# Patient Record
Sex: Male | Born: 1965 | Race: White | Hispanic: No | Marital: Single | Smoking: Never smoker
Health system: Southern US, Community
[De-identification: ages and names within clinical notes are randomized; demographics above are authoritative.]

## PROBLEM LIST (undated history)

## (undated) DIAGNOSIS — E669 Obesity, unspecified: Secondary | ICD-10-CM

## (undated) DIAGNOSIS — I712 Thoracic aortic aneurysm, without rupture: Secondary | ICD-10-CM

## (undated) DIAGNOSIS — I10 Essential (primary) hypertension: Secondary | ICD-10-CM

## (undated) DIAGNOSIS — I480 Paroxysmal atrial fibrillation: Secondary | ICD-10-CM

## (undated) DIAGNOSIS — I719 Aortic aneurysm of unspecified site, without rupture: Secondary | ICD-10-CM

## (undated) HISTORY — DX: Essential (primary) hypertension: I10

## (undated) HISTORY — DX: Thoracic aortic aneurysm, without rupture: I71.2

## (undated) HISTORY — DX: Aortic aneurysm of unspecified site, without rupture: I71.9

## (undated) HISTORY — DX: Obesity, unspecified: E66.9

## (undated) HISTORY — DX: Paroxysmal atrial fibrillation: I48.0

---

## 2014-11-04 ENCOUNTER — Emergency Department (INDEPENDENT_AMBULATORY_CARE_PROVIDER_SITE_OTHER)
Admission: EM | Admit: 2014-11-04 | Discharge: 2014-11-04 | Disposition: A | Discharge status: Home / Self Care | Payer: Self-pay | Source: Home / Self Care | Attending: Family Medicine | Admitting: Family Medicine

## 2014-11-04 ENCOUNTER — Encounter (HOSPITAL_COMMUNITY): Payer: Self-pay | Admitting: *Deleted

## 2014-11-04 ENCOUNTER — Emergency Department (INDEPENDENT_AMBULATORY_CARE_PROVIDER_SITE_OTHER): Payer: Self-pay

## 2014-11-04 DIAGNOSIS — S322XXA Fracture of coccyx, initial encounter for closed fracture: Secondary | ICD-10-CM

## 2014-11-04 MED ORDER — DICLOFENAC POTASSIUM 50 MG PO TABS: 50.0000 mg | ORAL_TABLET | Freq: Three times a day (TID) | ORAL | Status: DC
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
## 2014-11-04 NOTE — Discharge Instructions (Signed)
Heat, sit on cushion for comfort, see orthopedist if further problems
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
## 2016-07-11 DIAGNOSIS — I712 Thoracic aortic aneurysm, without rupture: Secondary | ICD-10-CM

## 2016-07-11 DIAGNOSIS — I7121 Aneurysm of the ascending aorta, without rupture: Secondary | ICD-10-CM

## 2016-07-11 HISTORY — DX: Thoracic aortic aneurysm, without rupture: I71.2

## 2016-07-11 HISTORY — DX: Aneurysm of the ascending aorta, without rupture: I71.21

## 2016-07-23 ENCOUNTER — Encounter (HOSPITAL_COMMUNITY): Payer: Self-pay | Admitting: Oncology

## 2016-07-23 ENCOUNTER — Emergency Department (HOSPITAL_COMMUNITY)
Admission: EM | Admit: 2016-07-23 | Discharge: 2016-07-24 | DRG: 309 | Disposition: A | Discharge status: Home / Self Care | Payer: 59 | Attending: Cardiovascular Disease | Admitting: Cardiovascular Disease

## 2016-07-23 DIAGNOSIS — E876 Hypokalemia: Secondary | ICD-10-CM | POA: Diagnosis not present

## 2016-07-23 DIAGNOSIS — Z6841 Body Mass Index (BMI) 40.0 and over, adult: Secondary | ICD-10-CM | POA: Diagnosis not present

## 2016-07-23 DIAGNOSIS — I4891 Unspecified atrial fibrillation: Secondary | ICD-10-CM | POA: Diagnosis present

## 2016-07-23 DIAGNOSIS — R0602 Shortness of breath: Secondary | ICD-10-CM

## 2016-07-23 DIAGNOSIS — I480 Paroxysmal atrial fibrillation: Secondary | ICD-10-CM | POA: Diagnosis present

## 2016-07-23 DIAGNOSIS — E669 Obesity, unspecified: Secondary | ICD-10-CM

## 2016-07-23 LAB — CBC WITH DIFFERENTIAL/PLATELET
BASOS ABS: 0.1 10*3/uL (ref 0.0–0.1)
BASOS PCT: 0 %
Eosinophils Absolute: 0.6 10*3/uL (ref 0.0–0.7)
Eosinophils Relative: 5 %
HCT: 45.9 % (ref 39.0–52.0)
Hemoglobin: 15.5 g/dL (ref 13.0–17.0)
LYMPHS PCT: 34 %
Lymphs Abs: 3.9 10*3/uL (ref 0.7–4.0)
MCH: 30.3 pg (ref 26.0–34.0)
MCHC: 33.8 g/dL (ref 30.0–36.0)
MCV: 89.6 fL (ref 78.0–100.0)
Monocytes Absolute: 0.9 10*3/uL (ref 0.1–1.0)
Monocytes Relative: 8 %
Neutro Abs: 5.9 10*3/uL (ref 1.7–7.7)
Neutrophils Relative %: 53 %
Platelets: 343 10*3/uL (ref 150–400)
RBC: 5.12 MIL/uL (ref 4.22–5.81)
RDW: 13.4 % (ref 11.5–15.5)
WBC: 11.3 10*3/uL — AB (ref 4.0–10.5)

## 2016-07-23 LAB — RAPID URINE DRUG SCREEN, HOSP PERFORMED
AMPHETAMINES: NOT DETECTED
BENZODIAZEPINES: NOT DETECTED
Barbiturates: NOT DETECTED
COCAINE: NOT DETECTED
Opiates: NOT DETECTED
Tetrahydrocannabinol: NOT DETECTED

## 2016-07-23 MED ORDER — DILTIAZEM HCL-DEXTROSE 100-5 MG/100ML-% IV SOLN (PREMIX)
5.0000 mg/h | INTRAVENOUS | Status: DC
  Administered 2016-07-23: 5 mg/h via INTRAVENOUS
  Filled 2016-07-23 (×2): qty 100

## 2016-07-23 MED ORDER — DILTIAZEM LOAD VIA INFUSION
10.0000 mg | Freq: Once | INTRAVENOUS | Status: AC
  Administered 2016-07-23: 10 mg via INTRAVENOUS
  Filled 2016-07-23: qty 10

## 2016-07-23 MED ORDER — ADENOSINE 6 MG/2ML IV SOLN
INTRAVENOUS | Status: AC
  Filled 2016-07-23: qty 2

## 2016-07-23 MED ORDER — ADENOSINE 6 MG/2ML IV SOLN
6.0000 mg | Freq: Once | INTRAVENOUS | Status: AC
  Administered 2016-07-23: 6 mg via INTRAVENOUS

## 2016-07-23 MED ORDER — ADENOSINE 6 MG/2ML IV SOLN
12.0000 mg | Freq: Once | INTRAVENOUS | Status: AC
  Administered 2016-07-23: 12 mg via INTRAVENOUS

## 2016-07-23 NOTE — ED Triage Notes (Signed)
Pt states he was sitting on the couch when he began to feel like his heart was, "Fluttering."  Pt states that he attempted to have a BM w/o relief.  Pt then developed Shob.  Pt is able to speak in full sentences at this time.

## 2016-07-23 NOTE — ED Notes (Signed)
ED Provider at bedside. 

## 2016-07-23 NOTE — ED Provider Notes (Signed)
[facility]-EMERGENCY DEPT Provider Note   CSN: [id number] Arrival date & time: 07/23/16  2208     History   Chief Complaint Chief Complaint  Patient presents with  . Tachycardia    HPI Kenneth Kirby is a 51 y.o. male.  The patient presents to the emergency department with complaint of a feeling his heart was 'fluttering'. Symptoms started around 9:00 pm while at rest. No chest pain or tightness. Later he developed SOB that was worse with exertion. No nausea or vomiting. He felt he needed to have a bowel movement and attempted to but was unable. No lightheadedness, dizziness, syncope/near syncope. He denies similar symptoms in the past.    The history is provided by the patient. No language interpreter was used.    History reviewed. No pertinent past medical history.  There are no active problems to display for this patient.   History reviewed. No pertinent surgical history.     Home Medications    Prior to Admission medications   Medication Sig Start Date End Date Taking? Authorizing Provider  diclofenac (CATAFLAM) 50 MG tablet Take 1 tablet (50 mg total) by mouth 3 (three) times daily. 11/04/14   Linna HoffKindl, James D, MD  HYDROcodone-acetaminophen (NORCO/VICODIN) 5-325 MG tablet take 1 tablet by mouth every 8 to 12 hours if needed 06/27/16   [provider]  Naproxen (NAPROSYN PO) Take by mouth.    [provider]  phentermine 15 MG capsule Take 15 mg by mouth.    [provider]    Family History No family history on file.  Social History Social History  Substance Use Topics  . Smoking status: Never Smoker  . Smokeless tobacco: Never Used  . Alcohol use Yes     Allergies   Patient has no known allergies.   Review of Systems Review of Systems  Constitutional: Negative for chills, diaphoresis and fever.  HENT: Negative.   Respiratory: Positive for shortness of breath.   Cardiovascular: Positive for palpitations. Negative for chest pain.    Gastrointestinal: Negative.   Musculoskeletal: Negative.   Skin: Negative.   Neurological: Negative.      Physical Exam Updated Vital Signs BP 133/77   Pulse (!) 120   Temp 98.2 F (36.8 C) (Oral)   Resp 13   SpO2 94%   Physical Exam  Constitutional: He is oriented to person, place, and time. He appears well-developed and well-nourished.  HENT:  Head: Normocephalic.  Neck: Normal range of motion. Neck supple.  Cardiovascular: An irregularly irregular rhythm present. Tachycardia present.   Pulmonary/Chest: Effort normal and breath sounds normal.  Abdominal: Soft. Bowel sounds are normal. There is no tenderness. There is no rebound and no guarding.  Musculoskeletal: Normal range of motion. He exhibits no edema.  Neurological: He is alert and oriented to person, place, and time.  Skin: Skin is warm and dry. No rash noted.  Psychiatric: He has a normal mood and affect.     ED Treatments / Results  Labs (all labs ordered are listed, but only abnormal results are displayed) Labs Reviewed  CBC WITH DIFFERENTIAL/PLATELET  COMPREHENSIVE METABOLIC PANEL  TROPONIN I  RAPID URINE DRUG SCREEN, HOSP PERFORMED   Results for orders placed or performed during the hospital encounter of 07/23/16  CBC with Differential  Result Value Ref Range   WBC 11.3 (H) 4.0 - 10.5 K/uL   RBC 5.12 4.22 - 5.81 MIL/uL   Hemoglobin 15.5 13.0 - 17.0 g/dL   HCT 40.945.9 81.139.0 - 91.452.0 %  MCV 89.6 78.0 - 100.0 fL   MCH 30.3 26.0 - 34.0 pg   MCHC 33.8 30.0 - 36.0 g/dL   RDW 16.1 09.6 - 04.5 %   Platelets 343 150 - 400 K/uL   Neutrophils Relative % 53 %   Neutro Abs 5.9 1.7 - 7.7 K/uL   Lymphocytes Relative 34 %   Lymphs Abs 3.9 0.7 - 4.0 K/uL   Monocytes Relative 8 %   Monocytes Absolute 0.9 0.1 - 1.0 K/uL   Eosinophils Relative 5 %   Eosinophils Absolute 0.6 0.0 - 0.7 K/uL   Basophils Relative 0 %   Basophils Absolute 0.1 0.0 - 0.1 K/uL  Comprehensive metabolic panel  Result Value Ref Range    Sodium 142 135 - 145 mmol/L   Potassium 3.3 (L) 3.5 - 5.1 mmol/L   Chloride 107 101 - 111 mmol/L   CO2 23 22 - 32 mmol/L   Glucose, Bld 117 (H) 65 - 99 mg/dL   BUN 16 6 - 20 mg/dL   Creatinine, Ser 4.09 0.61 - 1.24 mg/dL   Calcium 9.2 8.9 - 81.1 mg/dL   Total Protein 7.8 6.5 - 8.1 g/dL   Albumin 4.6 3.5 - 5.0 g/dL   AST 29 15 - 41 U/L   ALT 31 17 - 63 U/L   Alkaline Phosphatase 77 38 - 126 U/L   Total Bilirubin 0.6 0.3 - 1.2 mg/dL   GFR calc non Af Amer >60 >60 mL/min   GFR calc Af Amer >60 >60 mL/min   Anion gap 12 5 - 15  Troponin I  Result Value Ref Range   Troponin I <0.03 <0.03 ng/mL  Urine rapid drug screen (hosp performed)  Result Value Ref Range   Opiates NONE DETECTED NONE DETECTED   Cocaine NONE DETECTED NONE DETECTED   Benzodiazepines NONE DETECTED NONE DETECTED   Amphetamines NONE DETECTED NONE DETECTED   Tetrahydrocannabinol NONE DETECTED NONE DETECTED   Barbiturates NONE DETECTED NONE DETECTED     EKG  EKG Interpretation None       Radiology No results found.  Procedures Procedures (including critical care time)  Medications Ordered in ED Medications  diltiazem (CARDIZEM) 1 mg/mL load via infusion 10 mg (10 mg Intravenous Bolus from Bag 07/23/16 2257)    And  diltiazem (CARDIZEM) 100 mg in dextrose 5% (1 mg/mL) infusion (5 mg/hr Intravenous New Bag/Given 07/23/16 2259)  adenosine (ADENOCARD) 6 MG/2ML injection 6 mg (6 mg Intravenous Given 07/23/16 2234)  adenosine (ADENOCARD) 6 MG/2ML injection 12 mg (12 mg Intravenous Given 07/23/16 2237)   CRITICAL CARE Performed by: Elpidio Anis A   Total critical care time: 45 minutes  Critical care time was exclusive of separately billable procedures and treating other patients.  Critical care was necessary to treat or prevent imminent or life-threatening deterioration.  Critical care was time spent personally by me on the following activities: development of treatment plan with patient and/or  surrogate as well as nursing, discussions with consultants, evaluation of patient's response to treatment, examination of patient, obtaining history from patient or surrogate, ordering and performing treatments and interventions, ordering and review of laboratory studies, ordering and review of radiographic studies, pulse oximetry and re-evaluation of patient's condition.   Initial Impression / Assessment and Plan / ED Course  I have reviewed the triage vital signs and the nursing notes.  Pertinent labs & imaging results that were available during my care of the patient were reviewed by me and considered in my medical  decision making (see chart for details).     Patient with atrial fibrillation on arrival to ED RVR to rate as high as 180. No chest pain. No history arrhythmia.   6 mg IV adenosine given without change followed by 12 mg Adenosine, also without change.   Cardioversion vs cardizem discussed and 10 mg IV Cardizem bolus started with infusion following.   He is feeling improved - heart rate now from 90's to 120, confirmed to be atrial fibrillation. Discussed with cardiology, Dr. Dimple Casey, who accepts the patient for transfer to [facility] onto his service. Patient and family updated.   Final Clinical Impressions(s) / ED Diagnoses   Final diagnoses:  None   1. Atrial fibrillation with RVR   New Prescriptions New Prescriptions   No medications on file     Elpidio Anis, Cordelia Poche 07/24/16 1610    Devoria Albe, MD 07/24/16 [phone]

## 2016-07-23 NOTE — ED Notes (Signed)
Bed: WA07 Expected date:  Expected time:  Means of arrival:  Comments: 

## 2016-07-24 ENCOUNTER — Inpatient Hospital Stay (HOSPITAL_COMMUNITY): Payer: 59

## 2016-07-24 ENCOUNTER — Telehealth: Payer: Self-pay | Admitting: Internal Medicine

## 2016-07-24 DIAGNOSIS — E669 Obesity, unspecified: Secondary | ICD-10-CM | POA: Diagnosis not present

## 2016-07-24 DIAGNOSIS — R0602 Shortness of breath: Secondary | ICD-10-CM

## 2016-07-24 DIAGNOSIS — I4891 Unspecified atrial fibrillation: Secondary | ICD-10-CM

## 2016-07-24 DIAGNOSIS — E876 Hypokalemia: Secondary | ICD-10-CM | POA: Diagnosis not present

## 2016-07-24 DIAGNOSIS — I480 Paroxysmal atrial fibrillation: Secondary | ICD-10-CM | POA: Diagnosis present

## 2016-07-24 LAB — COMPREHENSIVE METABOLIC PANEL
ALK PHOS: 77 U/L (ref 38–126)
ALT: 31 U/L (ref 17–63)
ANION GAP: 12 (ref 5–15)
AST: 29 U/L (ref 15–41)
Albumin: 4.6 g/dL (ref 3.5–5.0)
BUN: 16 mg/dL (ref 6–20)
CALCIUM: 9.2 mg/dL (ref 8.9–10.3)
CO2: 23 mmol/L (ref 22–32)
CREATININE: 1.16 mg/dL (ref 0.61–1.24)
Chloride: 107 mmol/L (ref 101–111)
GFR calc Af Amer: >60 mL/min (ref >60)
GFR calc non Af Amer: >60 mL/min (ref >60)
Glucose, Bld: 117 mg/dL — ABNORMAL HIGH (ref 65–99)
Potassium: 3.3 mmol/L — ABNORMAL LOW (ref 3.5–5.1)
SODIUM: 142 mmol/L (ref 135–145)
Total Bilirubin: 0.6 mg/dL (ref 0.3–1.2)
Total Protein: 7.8 g/dL (ref 6.5–8.1)

## 2016-07-24 LAB — APTT: aPTT: 27 seconds (ref 24–36)

## 2016-07-24 LAB — TSH: TSH: 1.92 u[IU]/mL (ref 0.350–4.500)

## 2016-07-24 LAB — T4, FREE: Free T4: 1.07 ng/dL (ref 0.61–1.12)

## 2016-07-24 LAB — PROTIME-INR
INR: 0.95
PROTHROMBIN TIME: 12.7 s (ref 11.4–15.2)

## 2016-07-24 LAB — TROPONIN I: Troponin I: <0.03 ng/mL (ref <0.03)

## 2016-07-24 LAB — MAGNESIUM: Magnesium: 2.2 mg/dL (ref 1.7–2.4)

## 2016-07-24 LAB — BRAIN NATRIURETIC PEPTIDE: B NATRIURETIC PEPTIDE 5: 23.9 pg/mL (ref 0.0–100.0)

## 2016-07-24 MED ORDER — HEPARIN (PORCINE) IN NACL 100-0.45 UNIT/ML-% IJ SOLN
1500.0000 [IU]/h | INTRAMUSCULAR | Status: DC
  Administered 2016-07-24: 1500 [IU]/h via INTRAVENOUS
  Filled 2016-07-24: qty 250

## 2016-07-24 MED ORDER — DILTIAZEM HCL ER COATED BEADS 120 MG PO CP24: 120.0000 mg | ORAL_CAPSULE | Freq: Every day | ORAL | 1 refills | Status: DC

## 2016-07-24 MED ORDER — APIXABAN 5 MG PO TABS: 5.0000 mg | ORAL_TABLET | Freq: Two times a day (BID) | ORAL | 1 refills | Status: DC

## 2016-07-24 MED ORDER — MIDAZOLAM HCL 2 MG/2ML IJ SOLN
INTRAMUSCULAR | Status: AC | PRN
  Administered 2016-07-24: 4 mg via INTRAVENOUS

## 2016-07-24 MED ORDER — METOPROLOL TARTRATE 25 MG PO TABS
25.0000 mg | ORAL_TABLET | Freq: Two times a day (BID) | ORAL | Status: DC
  Administered 2016-07-24: 25 mg via ORAL
  Filled 2016-07-24: qty 1

## 2016-07-24 MED ORDER — ONDANSETRON HCL 4 MG/2ML IJ SOLN: 4.0000 mg | Freq: Four times a day (QID) | INTRAMUSCULAR | Status: DC | PRN

## 2016-07-24 MED ORDER — DILTIAZEM HCL ER COATED BEADS 120 MG PO CP24
120.0000 mg | ORAL_CAPSULE | Freq: Every day | ORAL | Status: DC
  Administered 2016-07-24: 120 mg via ORAL
  Filled 2016-07-24 (×2): qty 1

## 2016-07-24 MED ORDER — APIXABAN 5 MG PO TABS
5.0000 mg | ORAL_TABLET | Freq: Two times a day (BID) | ORAL | Status: DC
  Administered 2016-07-24: 5 mg via ORAL
  Filled 2016-07-24: qty 1

## 2016-07-24 MED ORDER — POTASSIUM CHLORIDE CRYS ER 20 MEQ PO TBCR
40.0000 meq | EXTENDED_RELEASE_TABLET | Freq: Once | ORAL | Status: AC
  Administered 2016-07-24: 40 meq via ORAL
  Filled 2016-07-24: qty 2

## 2016-07-24 MED ORDER — ETOMIDATE 2 MG/ML IV SOLN
INTRAVENOUS | Status: AC | PRN
  Administered 2016-07-24: 4 mg via INTRAVENOUS

## 2016-07-24 MED ORDER — HEPARIN BOLUS VIA INFUSION
5000.0000 [IU] | Freq: Once | INTRAVENOUS | Status: AC
  Administered 2016-07-24: 5000 [IU] via INTRAVENOUS
  Filled 2016-07-24: qty 5000

## 2016-07-24 MED ORDER — MIDAZOLAM HCL 2 MG/2ML IJ SOLN
4.0000 mg | Freq: Once | INTRAMUSCULAR | Status: AC
  Administered 2016-07-24: 2 mg via INTRAVENOUS
  Filled 2016-07-24: qty 4

## 2016-07-24 MED ORDER — ETOMIDATE 2 MG/ML IV SOLN
16.0000 mg | Freq: Once | INTRAVENOUS | Status: AC
  Administered 2016-07-24: 4 mg via INTRAVENOUS
  Filled 2016-07-24: qty 10

## 2016-07-24 MED ORDER — ACETAMINOPHEN 325 MG PO TABS: 650.0000 mg | ORAL_TABLET | ORAL | Status: DC | PRN

## 2016-07-24 NOTE — Telephone Encounter (Signed)
Received call from Elpidio AnisShari Upstill, PA-C.  This is a 51 year old man with no significant past medical history who presented in atrial fibrillation with RVR (HR 180s). He received Adenosine x 2 with no effect. He then received a bolus of Diltiazem and was placed on gtt, resulting in good rate control (HR ~90). He has no obvious precipitating trigger (no drugs/aclohol, infection, etc). I have accepted him cardiology inpatient service ([facility] for management of his atrial fibrillation.  Berdine DanceEric Pauley, MD

## 2016-07-24 NOTE — Progress Notes (Signed)
   Message sent to our office for outpatient echo and follow up with Afib Clinic or Dr. Duke Salviaandolph, MD in 7 days.

## 2016-07-24 NOTE — Discharge Instructions (Signed)
Call Dr. Leonides Sakeandolph's office to schedule follow-up in the atrial fibrillation clinic next week. Return to the emergency department if you have any recurrence of racing heart, chest pain or palpitations

## 2016-07-24 NOTE — H&P (Addendum)
Patient ID: Kenneth Kirby MRN: [id number], DOB/AGE: 1965/09/04  Admit date: 07/23/2016 Primary Physician: Patient, No Pcp Per Primary Cardiologist: N/A  CARDIOLOGY ADMISSION History & Physical    HISTORY OF PRESENT ILLNESS   51 year old man with no significant past medical history presents with afib with RVR. He was in his normal state of health and feeling well until this evening when he was watching TV and suddenly felt his heart racing. He felt as though he could not get a deep breath and did feel fatigued. No CP, diaphoresis, syncope, presyncope. He presented to the ED where he was found to be in afib with RVR (HR 180's). He did not respond to adenosine x 2, but did respond to diltiazem push + gtt. He is now comfortable and feeling well.  He tells me that he had two energy drinks on 7/13. No illicit drug use or binge drinking. No recent illnesses. He was on prednisone and antibiotics for a wound to his scalp, but those medications finished several days ago. No fevers, chills, nausea, vomiting, diarrhea.  Review of Systems All other systems reviewed and are otherwise negative except as noted above.   MEDICAL, FAMILY, AND SOCIAL HISTORY  History reviewed. No pertinent past medical history.  History reviewed. No pertinent surgical history.  No Known Allergies Prior to Admission medications   Medication Sig Start Date End Date Taking? Authorizing Provider  diclofenac (CATAFLAM) 50 MG tablet Take 1 tablet (50 mg total) by mouth 3 (three) times daily. 11/04/14   Linna Hoff, MD  HYDROcodone-acetaminophen (NORCO/VICODIN) 5-325 MG tablet take 1 tablet by mouth every 8 to 12 hours if needed 06/27/16   [provider]  Naproxen (NAPROSYN PO) Take by mouth.    [provider]  phentermine 15 MG capsule Take 15 mg by mouth.    [provider]   No family history on file. Social History   Social History  . Marital status: Single    Spouse name: N/A  . Number of  children: N/A  . Years of education: N/A   Occupational History  . Not on file.   Social History Main Topics  . Smoking status: Never Smoker  . Smokeless tobacco: Never Used  . Alcohol use Yes  . Drug use: No  . Sexual activity: Yes   Other Topics Concern  . Not on file   Social History Narrative  . No narrative on file      PHYSICAL EXAM  Blood pressure (!) 157/107, pulse (!) 112, temperature 98.2 F (36.8 C), temperature source Oral, resp. rate 19, SpO2 97 %.  General: Pleasant, NAD Psych: Normal affect. Neuro: Alert and oriented X 3. Moves all extremities spontaneously. HEENT: Sclera anicteric, noninjected. MMM.  Neck: no bruits. JVP is not elevated. Lungs:  Clear, normal WOB Heart: tachycardic, irregular, no m/r/g Abdomen: Soft, non-tender, non-distended, BS + x 4.  Extremities: No clubbing or cyanosis. Edema: trace BL. DP/PT/Radials 2+ and equal bilaterally.   LABS and STUDIES  Troponin (Point of Care Test) No results for input(s): TROPIPOC in the last 72 hours.  Recent Labs  07/23/16 2309  TROPONINI <0.03   Lab Results  Component Value Date   WBC 11.3 (H) 07/23/2016   HGB 15.5 07/23/2016   HCT 45.9 07/23/2016   MCV 89.6 07/23/2016   PLT 343 07/23/2016    Recent Labs Lab 07/23/16 2309  NA 142  K 3.3*  CL 107  CO2 23  BUN 16  CREATININE  1.16  CALCIUM 9.2  PROT 7.8  BILITOT 0.6  ALKPHOS 77  ALT 31  AST 29  GLUCOSE 117*   No results found for: CHOL, HDL, LDLCALC, TRIG No results found for: DDIMER   Radiology/Studies: pending  ECG was independently reviewed. Afib with RVR, inferior/lateral ST depressions (HR 180) Repeat ECG is pending  Echocardiogram: pending   ASSESSMENT and PLAN   Blythe StanfordBrian Madding is a 51 year old man with no significant past medical history who presents with afib with RVR.  Afib with RVR. Currently rate controlled on Diltiazem drip. The patient has no clear precipitating causes for his afib.  Starting Metoprolol 25 mg  BID in an attempt to DC Dilt drip. This dose can be uptitrated PRN  Anticoagulation with Heparin for possible DCCV in AM  Regarding long-term stroke risk reduction, CHA2DS2-VASc = 0 (assuming no HTN or CHF)  K>4, Mg>2  TSH, T4, A1C, Lipids are all pending. Urine tox was negative. He denies ETOH use.  Given body habitus he is at risk for OSA. Sleep study can be considered as an outpatient   Echocardiogram is pending.  Hypokalemia  Potassium oral repletion  Obesity  May benefit from outpatient sleep study as mentioned above  IVF: None Diet: NPO DVT PPX: Heparin Dispo: Admit to Obs, cardiology Code Status: Full code   PAULEY, ERIC D, MD 07/24/2016, 4:41 AM   Attending Addendum: Mr. Kenneth Kirby is a 89M with obesity and no known prior medical history who presented with sudden onset of palpitations 07/23/16 at 21:00.  He denies chest pain, shortness of breath, lower extremity edema, orthopnea or PND prior to developing palpitations.  He has been otherwise in his usual state of health.  He does not drink much caffeine or EtOH.  He endorses snoring, but fiance denies apneic episodes.  His mother was recently diagnosed with atrial fibrillation.  He did note feeling short of breath while walking across the parking lot to the ED.  Initial heart rate was 191.  He did not convert with adenosine.  He was started on a diltiazem drip and oral metoprolol.  Heart rates are currently in the 100s and he is comfortable on diltiazem at 5mg /hr.  He is euvolemic on exam.  Exam is unremarkable other than an irregularly irregular heart room.  We will plan to cardiovert him in the ED.  Start diltiazem 120mg  daily and Eliquis 5mg  bid.  Plan for out patient TTE and sleep study.  Follow up in atrial fibrillation clinic this week and with me in one month.  Hersh Minney C. Duke Salviaandolph, MD, [facility]  07/24/2016 9:05 AM

## 2016-07-24 NOTE — Progress Notes (Signed)
ANTICOAGULATION CONSULT NOTE - Initial Consult  Pharmacy Consult for apixaban Indication: atrial fibrillation  No Known Allergies  Patient Measurements: Height: 6' (182.9 cm) Weight: (!) 302 lb (137 kg) IBW/kg (Calculated) : 77.6 Heparin Dosing Weight: 96 kg  Vital Signs: Temp: 98.2 F (36.8 C) (07/13 2222) Temp Source: Oral (07/13 2222) BP: 133/96 (07/14 0937) Pulse Rate: 77 (07/14 0937)  Labs:  Recent Labs  07/23/16 2309 07/24/16 0504  HGB 15.5  --   HCT 45.9  --   PLT 343  --   APTT  --  27  LABPROT  --  12.7  INR  --  0.95  CREATININE 1.16  --   TROPONINI <0.03  --     Estimated Creatinine Clearance: 110.5 mL/min (by C-G formula based on SCr of 1.16 mg/dL).   Medical History: History reviewed. No pertinent past medical history.  Medications:  Scheduled:  . apixaban  5 mg Oral BID  . diltiazem  120 mg Oral Daily  . etomidate  16 mg Intravenous Once  . metoprolol tartrate  25 mg Oral BID  . midazolam  4 mg Intravenous Once   Infusions:    Assessment: [age] yoM with new onset A-fib. apixaban per Rx.  Goal of Therapy:  Monitor platelets by anticoagulation protocol: Yes   Plan:  Apixaban 5 mg PO BID  Monitor for signs and symptoms of bleeding Monitor renal function    Adalberto ColeNikola Mattisen Pohlmann, PharmD, BCPS Pager [phone] 07/24/2016 9:42 AM

## 2016-07-24 NOTE — Sedation Documentation (Signed)
He was given synchronized cardioversion at this time of 150 joules; with  Immediate cessation of a-fib. Into nsr.

## 2016-07-24 NOTE — ED Notes (Signed)
Cardiologist at bedside.  

## 2016-07-24 NOTE — ED Provider Notes (Signed)
Cardiology has requested procedural sedation for cardioversion. Patient has new onset atrial fibrillation.  Procedural sedation  Patient is alert and appropriate with clear mental status. Patient does have significant facial hair and obesity. Normal range of motion of jaw and mouth opening to 3 finger widths. Posterior airway visualization Mallampati 4. Patient has good dentition. Normal range of motion of neck. Bilateral breath sounds clear with good airflow. Heart irregularly irregular. Peripheral pulses intact. Patient does not have significant peripheral edema. Skin is warm and mildly diaphoretic. Mental status is clear with normal motor function.   Performed by: Arby BarrettePfeiffer, Eydan Chianese Consent: Verbal consent obtained. Risks and benefits: risks, benefits and alternatives were discussed Required items: required blood products, implants, devices, and special equipment available Patient identity confirmed: arm band and provided demographic data Time out: Immediately prior to procedure a "time out" was called to verify the correct patient, procedure, equipment, support staff and site/side marked as required.  Sedation type: moderate (conscious) sedation NPO time confirmed and considedered  Sedatives: ETOMIDATE  Physician Time at Bedside: 20  Vitals: Vital signs were monitored during sedation. Cardiac Monitor, pulse oximeter Patient tolerance: Patient tolerated the procedure well with no immediate complications. Comments: Pt with uneventful recovered. Returned to pre-procedural sedation baseline 09:35 patient has stable vital signs and is alert and oriented. Answering questions appropriately. No complications during sedation.   Arby BarrettePfeiffer, Siennah Barrasso, MD 07/24/16 [phone]

## 2016-07-24 NOTE — ED Notes (Signed)
Writer made two unsuccessful attempts to draw blood.

## 2016-07-24 NOTE — Progress Notes (Signed)
ANTICOAGULATION CONSULT NOTE - Initial Consult  Pharmacy Consult for IV heparin Indication: atrial fibrillation  No Known Allergies  Patient Measurements:   Heparin Dosing Weight: 96 kg  Vital Signs: Temp: 98.2 F (36.8 C) (07/13 2222) Temp Source: Oral (07/13 2222) BP: 157/107 (07/14 0300) Pulse Rate: 112 (07/14 0306)  Labs:  Recent Labs  07/23/16 2309  HGB 15.5  HCT 45.9  PLT 343  CREATININE 1.16  TROPONINI <0.03    CrCl cannot be calculated (Unknown ideal weight.).   Medical History: History reviewed. No pertinent past medical history.  Medications:  Scheduled:  . potassium chloride SA  40 mEq Oral Once   Infusions:  . diltiazem (CARDIZEM) infusion 5 mg/hr (07/23/16 2259)    Assessment: [age] yoM with new onset A-fib.  IV heparin per Rx.  Goal of Therapy:  Heparin level 0.3-0.7 units/ml Monitor platelets by anticoagulation protocol: Yes   Plan:  Baseline aptt and PT/INR STAT Heparin 5000 unit bolus x1 now Then start drip at 1500 units/hr Daily HL/CBC Check 1st HL in 6 hours  Lorenza EvangelistGreen, Winfred Iiams R 07/24/2016,4:36 AM

## 2016-07-24 NOTE — CV Procedure (Signed)
Electrical Cardioversion Procedure Note Kenneth StanfordBrian Kirby [id number] 10/27/1965  Procedure: Electrical Cardioversion Indications:  Atrial Fibrillation  Procedure Details Consent: Risks of procedure as well as the alternatives and risks of each were explained to the (patient/caregiver).  Consent for procedure obtained. Time Out: Verified patient identification, verified procedure, site/side was marked, verified correct patient position, special equipment/implants available, medications/allergies/relevent history reviewed, required imaging and test results available.  Performed  Patient placed on cardiac monitor, pulse oximetry, supplemental oxygen as necessary.  Sedation given: Benzodiazepines and Etomidate Pacer pads placed anterior and posterior chest.  Cardioverted 1 time(s).  Cardioverted at 150J.  Evaluation Findings: Post procedure EKG shows: NSR Complications: None Patient did tolerate procedure well.   Kenneth Siiffany [place], MD 07/24/2016, 9:31 AM

## 2016-07-24 NOTE — ED Provider Notes (Signed)
Have supervised patient for cardioversion for cardiology. Dr. Duke Salviaandolph advised that post cardioversion if patient remained in sinus rhythm and stable, he should be discharged with Cardizem 120 and Elquis 5 mg twice a day. Patient discharged in stable condition with follow-up plan reviewed.   Arby BarrettePfeiffer, Jaynie Hitch, MD 07/24/16 [phone]

## 2016-07-30 ENCOUNTER — Encounter: Payer: Self-pay | Admitting: Cardiovascular Disease

## 2016-07-30 ENCOUNTER — Telehealth: Payer: Self-pay | Admitting: *Deleted

## 2016-07-30 ENCOUNTER — Ambulatory Visit (INDEPENDENT_AMBULATORY_CARE_PROVIDER_SITE_OTHER): Payer: 59 | Admitting: Cardiovascular Disease

## 2016-07-30 VITALS — BP 140/82 | HR 91 | Ht 72.0 in | Wt 307.2 lb

## 2016-07-30 DIAGNOSIS — I480 Paroxysmal atrial fibrillation: Secondary | ICD-10-CM | POA: Diagnosis not present

## 2016-07-30 DIAGNOSIS — I4891 Unspecified atrial fibrillation: Secondary | ICD-10-CM

## 2016-07-30 NOTE — Progress Notes (Signed)
Cardiology Office Note   Date:  07/30/2016   ID:  Kenneth StanfordBrian Kirby, DOB 02/17/1965, MRN [id number]  PCP:  Patient, No Pcp Per  Cardiologist:   Kenneth Siiffany [place], MD   Chief Complaint  Patient presents with  . New Patient (Initial Visit)      History of Present Illness: Kenneth Kirby is a 51 y.o. male with paroxysmal atrial fibrillation who presents for follow up.  He was seen and Kenneth Kirby 7/14 with the acute onset of palpitations.  He was in his normal state of health and feeling well until this evening when he was watching TV and suddenly felt his heart racing. He felt as though he could not get a deep breath and did feel fatigued. No CP, diaphoresis, syncope, presyncope. He presented to the ED where he was found to be in afib with RVR (HR 180's). He did not respond to adenosine x 2, but did respond to diltiazem push + gtt. he underwent cardioversion in the emergency department. He was started on diltiazem 120 mg daily and apixaban 5 mg twice daily. Since being discharged from the hospital he has not had any recurrent sustained arrhythmias. He feels sluggish, which he attributes to the diltiazem. He occasionally notes short flutters but nothing prolonged. He denies lower extremity edema, orthopnea, or PND. Kenneth Kirby has noted some lighthededness upon standing.  He does not check his blood pressure regularly at home.  Prior to his episode of atrial fibrillation Kenneth Kirby was drinking and energy drinks most mornings. He drank alcohol socially but not regularly.  He endorses snoring but his wife has not noted any apneic episodes. He denies daytime somnolence.   Past Medical History:  Diagnosis Date  . Hypertension   . Obesity   . Paroxysmal atrial fibrillation (HCC)     No past surgical history on file.   Current Outpatient Prescriptions  Medication Sig Dispense Refill  . apixaban (ELIQUIS) 5 MG TABS tablet Take 1 tablet (5 mg total) by mouth 2 (two) times daily. 60 tablet 1  . aspirin  EC 81 MG tablet Take 81 mg by mouth 2 (two) times daily.    Marland Kitchen. BIOTIN 5000 PO Take 5,000 mcg by mouth daily.    Marland Kitchen. diltiazem (CARDIZEM CD) 120 MG 24 hr capsule Take 1 capsule (120 mg total) by mouth daily. 30 capsule 1   No current facility-administered medications for this visit.     Allergies:   Patient has no known allergies.    Social History:  The patient  reports that he has never smoked. He has never used smokeless tobacco. He reports that he drinks alcohol. He reports that he does not use drugs.   Family History:  The patient's family history includes Aortic aneurysm in his mother; Atrial fibrillation in his mother; Emphysema in his maternal grandmother; Kenneth Kirby' disease in his mother; Hypertension in his mother; Stomach cancer in his paternal grandmother.    ROS:  Please see the history of present illness.   Otherwise, review of systems are positive for none.   All other systems are reviewed and negative.    PHYSICAL EXAM: VS:  BP 140/82   Pulse 91   Ht 6' (1.829 m)   Wt (!) 139.3 kg (307 lb 3.2 oz)   BMI 41.66 kg/m  , BMI Body mass index is 41.66 kg/m. GENERAL:  Well appearing HEENT:  Pupils equal round and reactive, fundi not visualized, oral mucosa unremarkable NECK:  No jugular venous distention, waveform within normal  limits, carotid upstroke brisk and symmetric, no bruits, no thyromegaly LYMPHATICS:  No cervical adenopathy LUNGS:  Clear to auscultation bilaterally HEART:  RRR.  PMI not displaced or sustained,S1 and S2 within normal limits, no S3, no S4, no clicks, no rubs, no murmurs ABD:  Flat, positive bowel sounds normal in frequency in pitch, no bruits, no rebound, no guarding, no midline pulsatile mass, no hepatomegaly, no splenomegaly EXT:  2 plus pulses throughout, no edema, no cyanosis no clubbing SKIN:  No rashes no nodules NEURO:  Cranial nerves II through XII grossly intact, motor grossly intact throughout PSYCH:  Cognitively intact, oriented to person place  and time   EKG:  EKG is ordered today. The ekg ordered  07/30/16 demonstrates sinus rhythm. Rate 91 bpm. Incomplete right bundle branch block.  Recent Labs: 07/23/2016: ALT 31; B Natriuretic Peptide 23.9; BUN 16; Creatinine, Ser 1.16; Hemoglobin 15.5; Magnesium 2.2; Platelets 343; Potassium 3.3; Sodium 142; TSH 1.920    Lipid Panel No results found for: CHOL, TRIG, HDL, CHOLHDL, VLDL, LDLCALC, LDLDIRECT    Wt Readings from Last 3 Encounters:  07/30/16 (!) 139.3 kg (307 lb 3.2 oz)  07/24/16 (!) 137 kg (302 lb)      ASSESSMENT AND PLAN:  # Paroxysmal atrial fibrillation: No recurrent episodes since DCCV.  We   discussed switching diltiazem to an alternative agent given that it is causing fatigue. He wants to continue for now.  Continue Apixaban for at least 30 days.  Cannot fully assess CHADS-Vasc score until after his echocardiogram. We will refer him for an echo. He will log his blood pressures at home, as he reports he actually has white coat hypertension. Suspect his blood pressure may be high at home as well, which would give him a score of at least one. This is a case he will need to continue on anticoagulation indefinitely. We will also get a sleep study.  Thyroid function was normal.  He will need to get a repeat BMP 2/2 hypokalemia.  # Morbid obesity: Discussed the importance of diet and exercise.    Current medicines are reviewed at length with the patient today.  The patient does not have concerns regarding medicines.  The following changes have been made:  no change  Labs/ tests ordered today include:   Orders Placed This Encounter  Procedures  . EKG 12-Lead  . ECHOCARDIOGRAM COMPLETE  . Split night study     Disposition:   FU with Juddson Cobern C. Duke Salvia, MD, [facility] in 6-8 weeks.    This note was written with the assistance of speech recognition software.  Please excuse any transcriptional errors.  Signed, Orah Sonnen C. Duke Salvia, MD, [facility]  07/30/2016 12:54 PM    [facility]

## 2016-07-30 NOTE — Telephone Encounter (Signed)
Advised patient

## 2016-07-30 NOTE — Telephone Encounter (Signed)
Patient seen in office today and needs labs (bmet/mag) when gets Echo Left message to call back

## 2016-07-30 NOTE — Patient Instructions (Addendum)
Medication Instructions:  Your physician recommends that you continue on your current medications as directed. Please refer to the Current Medication list given to you today.  Labwork: NONE  Testing/Procedures: Your physician has requested that you have an echocardiogram. Echocardiography is a painless test that uses sound waves to create images of your heart. It provides your doctor with information about the size and shape of your heart and how well your heart's chambers and valves are working. This procedure takes approximately one hour. There are no restrictions for this procedure. [facility] [address]  Your physician has recommended that you have a sleep study. This test records several body functions during sleep, including: brain activity, eye movement, oxygen and carbon dioxide blood levels, heart rate and rhythm, breathing rate and rhythm, the flow of air through your mouth and nose, snoring, body muscle movements, and chest and belly movement.  Follow-Up: Your physician recommends that you schedule a follow-up appointment in: 6-8 WEEKS   LOG BLOOD PRESSURE AT HOME AND BRING TO YOUR FOLLOW UP   If you need a refill on your cardiac medications before your next appointment, please call your pharmacy.

## 2016-08-10 ENCOUNTER — Other Ambulatory Visit: Payer: Self-pay

## 2016-08-10 ENCOUNTER — Other Ambulatory Visit: Payer: 59

## 2016-08-10 ENCOUNTER — Ambulatory Visit (HOSPITAL_COMMUNITY): Payer: 59 | Attending: Cardiology

## 2016-08-10 DIAGNOSIS — I480 Paroxysmal atrial fibrillation: Secondary | ICD-10-CM

## 2016-08-10 DIAGNOSIS — I517 Cardiomegaly: Secondary | ICD-10-CM | POA: Diagnosis not present

## 2016-08-10 DIAGNOSIS — I4891 Unspecified atrial fibrillation: Secondary | ICD-10-CM

## 2016-08-10 MED ORDER — PERFLUTREN LIPID MICROSPHERE
1.0000 mL | INTRAVENOUS | Status: AC | PRN
  Administered 2016-08-10: 3 mL via INTRAVENOUS

## 2016-08-11 LAB — BASIC METABOLIC PANEL
BUN / CREAT RATIO: 14 (ref 9–20)
BUN: 12 mg/dL (ref 6–24)
CO2: 21 mmol/L (ref 20–29)
Calcium: 9.3 mg/dL (ref 8.7–10.2)
Chloride: 106 mmol/L (ref 96–106)
Creatinine, Ser: 0.87 mg/dL (ref 0.76–1.27)
GFR calc Af Amer: 117 mL/min/{1.73_m2} (ref >59)
GFR calc non Af Amer: 101 mL/min/{1.73_m2} (ref >59)
GLUCOSE: 81 mg/dL (ref 65–99)
POTASSIUM: 4.6 mmol/L (ref 3.5–5.2)
SODIUM: 142 mmol/L (ref 134–144)

## 2016-08-11 LAB — MAGNESIUM: MAGNESIUM: 2.2 mg/dL (ref 1.6–2.3)

## 2016-09-14 ENCOUNTER — Encounter: Payer: Self-pay | Admitting: Cardiovascular Disease

## 2016-09-14 NOTE — Progress Notes (Signed)
Cardiology Office Note   Date:  09/15/2016   ID:  Kenneth Kirby, DOB [birth year], MRN [id number]  PCP:  Patient, No Pcp Per  Cardiologist:   Chilton Si, MD   Chief Complaint  Patient presents with  . Follow-up     History of Present Illness: Kenneth Kirby is a 51 y.o. male with paroxysmal atrial fibrillation who presents for follow up.  He was seen at [facility] 07/2016 with new onset atrial fibrillation. He presented to the ED where he was found to be in afib with RVR (HR 180's). He did not respond to adenosine x 2, but did respond to diltiazem push + gtt. He underwent cardioversion in the emergency department. He was started on diltiazem 120 mg daily and apixaban 5 mg twice daily. He was referred for an echo 08/10/16 that revealed LVEF 55-60%and grade 1 diastolic dysfunction. The left atrium was normal in size.  He ws also noted to have an ascending aortic aneurysm (4.4 cm).  Of note, his mother has an ascending aortic aneurysm that has been stable.  At his last appointment his BP was elevated.  He was asked to keep a log of his BP.  He reports that his blood pressure has been mostly in the 130s to 150s systolic.  He was also referred for a sleep study that is scheduled for 9/15.  Kenneth Kirby continues to report fatigue after taking his morning medication.  He has been limiting salt intake and caffeine.  He is no longer using the vaporizer cigarettes.   Past Medical History:  Diagnosis Date  . Aneurysm of ascending aorta (HCC) 07/2016   4.4 cm  . 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  . Essential hypertension 09/15/2016  . Hypertension   . Obesity   . Paroxysmal atrial fibrillation (HCC)     No past surgical history on file.   Current Outpatient Prescriptions  Medication Sig Dispense Refill  . apixaban (ELIQUIS) 5 MG TABS tablet Take 1 tablet (5 mg total) by mouth 2 (two) times daily. 60 tablet 1  . BIOTIN 5000 PO Take 5,000 mcg by mouth daily.      . carvedilol (COREG) 6.25 MG tablet Take 1 tablet (6.25 mg total) by mouth 2 (two) times daily. 60 tablet 5   No current facility-administered medications for this visit.     Allergies:   Patient has no known allergies.    Social History:  The patient  reports that he has never smoked. He has never used smokeless tobacco. He reports that he drinks alcohol. He reports that he does not use drugs.   Family History:  The patient's family history includes Aortic aneurysm in his mother; Atrial fibrillation in his mother; Emphysema in his maternal grandmother; Luiz Blare' disease in his mother; Hypertension in his mother; Stomach cancer in his paternal grandmother.    ROS:  Please see the history of present illness.   Otherwise, review of systems are positive for none.   All other systems are reviewed and negative.    PHYSICAL EXAM: VS:  BP (!) 157/96   Pulse 93   Ht 6' 0.5" (1.842 m)   Wt (!) 138 kg (304 lb 3.2 oz)   BMI 40.69 kg/m  , BMI Body mass index is 40.69 kg/m. GENERAL:  Well appearing.  No acute distress HEENT: Pupils equal round and reactive, fundi not visualized, oral mucosa unremarkable NECK:  No jugular venous distention, waveform within normal  limits, carotid upstroke brisk and symmetric, no bruits, no thyromegaly LUNGS:  Clear to auscultation bilaterally HEART:  RRR.  PMI not displaced or sustained,S1 and S2 within normal limits, no S3, no S4, no clicks, no rubs, no murmurs ABD:  Flat, positive bowel sounds normal in frequency in pitch, no bruits, no rebound, no guarding, no midline pulsatile mass, no hepatomegaly, no splenomegaly EXT:  2 plus pulses throughout, no edema, no cyanosis no clubbing SKIN:  No rashes no nodules NEURO:  Cranial nerves II through XII grossly intact, motor grossly intact throughout PSYCH:  Cognitively intact, oriented to person place and time   EKG:  EKG is not  ordered today. The ekg ordered  07/30/16 demonstrates sinus rhythm. Rate 91 bpm.  Incomplete right bundle branch block.  Echo 08/10/16: Study Conclusions  - Left ventricle: The cavity size was normal. There was mild focal   basal hypertrophy of the septum. Systolic function was normal.   The estimated ejection fraction was in the range of 55% to 60%.   Wall motion was normal; there were no regional wall motion   abnormalities. Doppler parameters are consistent with abnormal   left ventricular relaxation (grade 1 diastolic dysfunction). - Aortic root: The aortic root was mildly dilated. - Ascending aorta: The ascending aorta was mildly dilated.  Impressions:  - Definity used; normal LV systolic function; mild diastolic   dysfunction; mildly dilated ascending aorta (4.4 cm); suggest CTA   to further assess.   Recent Labs: 07/23/2016: ALT 31; B Natriuretic Peptide 23.9; Hemoglobin 15.5; Platelets 343; TSH 1.920 08/10/2016: BUN 12; Creatinine, Ser 0.87; Magnesium 2.2; Potassium 4.6; Sodium 142    Lipid Panel No results found for: CHOL, TRIG, HDL, CHOLHDL, VLDL, LDLCALC, LDLDIRECT    Wt Readings from Last 3 Encounters:  09/15/16 (!) 138 kg (304 lb 3.2 oz)  07/30/16 (!) 139.3 kg (307 lb 3.2 oz)  07/24/16 (!) 137 kg (302 lb)      ASSESSMENT AND PLAN:  # Paroxysmal atrial fibrillation: No recurrent episodes since DCCV.  He has fatigue on diltiazem and we now know he has an ascending aorta aneurysm.  Stop diltiazem and start carvedilol 6.25mg  bid.  Can increase to 12.5mg  bid if BP remains >130/80.  Given his hypertension, continue Apixaban.  Sleep study pending.  This patients CHA2DS2-VASc Score and unadjusted Ischemic Stroke Rate (% per year) is equal to 0.6 % stroke rate/year from a score of 1 Above score calculated as 1 point each if present [CHF, HTN, DM, Vascular=MI/PAD/Aortic Plaque, Age if 65-74, or Male] Above score calculated as 2 points each if present [Age > 75, or Stroke/TIA/TE]  # Ascending aorta aneurysm:  4.4 cm by echo. Chest CT-A. Reassess  in 6 months.   # Morbid obesity: Discussed the importance of diet and exercise.    Current medicines are reviewed at length with the patient today.  The patient does not have concerns regarding medicines.  The following changes have been made:  Switch diltiazem to metoprolol.  Labs/ tests ordered today include:   Orders Placed This Encounter  Procedures  . CT ANGIO CHEST AORTA W &/OR WO CONTRAST  . Basic metabolic panel     Disposition:   FU with Nautika Cressey C. Duke Salviaandolph, MD, [facility] in 6 months.  Pharmacy for BP check in 2 weeks.    This note was written with the assistance of speech recognition software.  Please excuse any transcriptional errors.  Signed, Lila Lufkin C. Duke Salviaandolph, MD, [facility]  09/15/2016 12:53 PM  [facility]

## 2016-09-15 ENCOUNTER — Encounter: Payer: Self-pay | Admitting: Cardiovascular Disease

## 2016-09-15 ENCOUNTER — Ambulatory Visit (INDEPENDENT_AMBULATORY_CARE_PROVIDER_SITE_OTHER): Payer: 59 | Admitting: Cardiovascular Disease

## 2016-09-15 VITALS — BP 157/96 | HR 93 | Ht 72.5 in | Wt 304.2 lb

## 2016-09-15 DIAGNOSIS — I10 Essential (primary) hypertension: Secondary | ICD-10-CM | POA: Diagnosis not present

## 2016-09-15 DIAGNOSIS — I480 Paroxysmal atrial fibrillation: Secondary | ICD-10-CM

## 2016-09-15 DIAGNOSIS — Z01812 Encounter for preprocedural laboratory examination: Secondary | ICD-10-CM

## 2016-09-15 DIAGNOSIS — I719 Aortic aneurysm of unspecified site, without rupture: Secondary | ICD-10-CM

## 2016-09-15 DIAGNOSIS — I7121 Aneurysm of the ascending aorta, without rupture: Secondary | ICD-10-CM

## 2016-09-15 DIAGNOSIS — I712 Thoracic aortic aneurysm, without rupture: Secondary | ICD-10-CM | POA: Diagnosis not present

## 2016-09-15 HISTORY — DX: Essential (primary) hypertension: I10

## 2016-09-15 HISTORY — DX: Aortic aneurysm of unspecified site, without rupture: I71.9

## 2016-09-15 MED ORDER — CARVEDILOL 6.25 MG PO TABS: 6.2500 mg | ORAL_TABLET | Freq: Two times a day (BID) | ORAL | 5 refills | Status: DC

## 2016-09-15 NOTE — Patient Instructions (Addendum)
Medication Instructions:  STOP DILTIAZEM   START CARVEDILOL 6.25 MG TWICE A DAY  MONITOR YOUR BLOOD PRESSURE AT HOME. IF IT DOES NOT REMAIN BELOW 130 YOU MAY INCREASE YOUR CARVEDILOL TO 12.5 MG TWICE A DAY CALL OFFICE IF YOU INCREASE FOR NEW RX   Labwork: BMET TODAY   Testing/Procedures: Non-Cardiac CT Angiography (CTA), is a special type of CT scan that uses a computer to produce multi-dimensional views of major blood vessels throughout the body. In CT angiography, a contrast material is injected through an IV to help visualize the blood vessels CHEST CTA   Follow-Up: Your physician recommends that you schedule a follow-up appointment in: 2 WEEKS WITH PHARM D FOR BLOOD PRESSURE   Your physician wants you to follow-up in: 6 MONTH OV You will receive a reminder letter in the mail two months in advance. If you don't receive a letter, please call our office to schedule the follow-up appointment.  If you need a refill on your cardiac medications before your next appointment, please call your pharmacy.

## 2016-09-16 LAB — BASIC METABOLIC PANEL
BUN / CREAT RATIO: 13 (ref 9–20)
BUN: 14 mg/dL (ref 6–24)
CHLORIDE: 107 mmol/L — AB (ref 96–106)
CO2: 24 mmol/L (ref 20–29)
Calcium: 9.4 mg/dL (ref 8.7–10.2)
Creatinine, Ser: 1.11 mg/dL (ref 0.76–1.27)
GFR calc Af Amer: 90 mL/min/{1.73_m2} (ref >59)
GFR calc non Af Amer: 78 mL/min/{1.73_m2} (ref >59)
GLUCOSE: 91 mg/dL (ref 65–99)
Potassium: 4.9 mmol/L (ref 3.5–5.2)
SODIUM: 144 mmol/L (ref 134–144)

## 2016-09-20 ENCOUNTER — Encounter: Payer: Self-pay | Admitting: Cardiovascular Disease

## 2016-09-20 ENCOUNTER — Other Ambulatory Visit: Payer: Self-pay | Admitting: Pharmacist

## 2016-09-20 MED ORDER — APIXABAN 5 MG PO TABS: 5.0000 mg | ORAL_TABLET | Freq: Two times a day (BID) | ORAL | 1 refills | Status: DC

## 2016-09-23 ENCOUNTER — Telehealth: Payer: Self-pay | Admitting: *Deleted

## 2016-09-23 ENCOUNTER — Other Ambulatory Visit: Payer: Self-pay | Admitting: *Deleted

## 2016-09-23 ENCOUNTER — Ambulatory Visit (INDEPENDENT_AMBULATORY_CARE_PROVIDER_SITE_OTHER)
Admission: RE | Admit: 2016-09-23 | Discharge: 2016-09-23 | Disposition: A | Discharge status: Home / Self Care | Payer: 59 | Source: Ambulatory Visit | Attending: Cardiovascular Disease | Admitting: Cardiovascular Disease

## 2016-09-23 DIAGNOSIS — I10 Essential (primary) hypertension: Secondary | ICD-10-CM

## 2016-09-23 DIAGNOSIS — I712 Thoracic aortic aneurysm, without rupture: Secondary | ICD-10-CM | POA: Diagnosis not present

## 2016-09-23 DIAGNOSIS — I7121 Aneurysm of the ascending aorta, without rupture: Secondary | ICD-10-CM

## 2016-09-23 DIAGNOSIS — I480 Paroxysmal atrial fibrillation: Secondary | ICD-10-CM

## 2016-09-23 DIAGNOSIS — R0683 Snoring: Secondary | ICD-10-CM

## 2016-09-23 MED ORDER — IOPAMIDOL (ISOVUE-370) INJECTION 76%
100.0000 mL | Freq: Once | INTRAVENOUS | Status: AC | PRN
  Administered 2016-09-23: 100 mL via INTRAVENOUS

## 2016-09-23 NOTE — Telephone Encounter (Signed)
-----   Message from Britt Bologneseharmaine M Hall sent at 09/23/2016 12:54 PM EDT ----- Regarding: Denied In Lab Sleep Study Kenneth Kirby  Pt of Dr. Leonides Sakeandolph's.  [employer] denied pt's In lab sleep study.  Ok to change to HST?  Please inform pt.    Thanks  Charmaine

## 2016-09-23 NOTE — Telephone Encounter (Signed)
HST ordered.   In lab canceled and scheduled HST for Friday 9/21 at 11AM.     Patient aware and verbalized understanding.

## 2016-09-24 ENCOUNTER — Other Ambulatory Visit: Payer: 59

## 2016-09-27 ENCOUNTER — Encounter (HOSPITAL_BASED_OUTPATIENT_CLINIC_OR_DEPARTMENT_OTHER): Payer: 59

## 2016-09-30 ENCOUNTER — Ambulatory Visit (INDEPENDENT_AMBULATORY_CARE_PROVIDER_SITE_OTHER): Payer: 59 | Admitting: Pharmacist Clinician (PhC)/ Clinical Pharmacy Specialist

## 2016-09-30 DIAGNOSIS — I10 Essential (primary) hypertension: Secondary | ICD-10-CM | POA: Diagnosis not present

## 2016-09-30 MED ORDER — CARVEDILOL 12.5 MG PO TABS: 12.5000 mg | ORAL_TABLET | Freq: Two times a day (BID) | ORAL | 3 refills | Status: DC

## 2016-09-30 NOTE — Patient Instructions (Signed)
Return for a a follow up appointment in 3 weeks  Your blood pressure today is 152/102  (goal is <130/80)  Check your blood pressure at home once to twice and keep record of the readings.  Take your BP meds as follows:  increase carvedilol to 12.5 mg (2 tablets of the 6.25 mg) twice daily   Bring all of your meds, your BP cuff and your record of home blood pressures to your next appointment.  Exercise as you're able, try to walk approximately 30 minutes per day.  Keep salt intake to a minimum, especially watch canned and prepared boxed foods.  Eat more fresh fruits and vegetables and fewer canned items.  Avoid eating in fast food restaurants.    HOW TO TAKE YOUR BLOOD PRESSURE: . Rest 5 minutes before taking your blood pressure. .  Don't smoke or drink caffeinated beverages for at least 30 minutes before. . Take your blood pressure before (not after) you eat. . Sit comfortably with your back supported and both feet on the floor (don't cross your legs). . Elevate your arm to heart level on a table or a desk. . Use the proper sized cuff. It should fit smoothly and snugly around your bare upper arm. There should be enough room to slip a fingertip under the cuff. The bottom edge of the cuff should be 1 inch above the crease of the elbow. . Ideally, take 3 measurements at one sitting and record the average.

## 2016-09-30 NOTE — Progress Notes (Signed)
10/01/2016 Kenneth Kirby 17-Dec-1965 [id number]   HPI:  Kenneth Kirby is a 51 y.o. male patient of Dr Duke Salvia, with a PMH below who presents today for hypertension clinic evaluation.  He was in apparently good health until July, when he presented to the ER with AF with RVR.  He was cardioverted in ER and started on diltiazem 120 mg daily.  Soon afterward an echocardiogram showed grade 1 diastolic dysfunction and an ascending aortic aneurysm.   After this, his diltiazem was converted to carvedilol 6.25 mg bid.  His blood pressure was noted to be elevated at 157/96 at his last visit with Dr. Duke Salvia.  She has also scheduled him for a home sleep study.    Patient notes that he has been feeling better since stopping the diltiazem, but still not quite at 100%.  He has no complaints of chest pain, shortness of breath, edema or signs of postural hypotension.    Blood Pressure Goal:  130/80  Current Medications:  Carvedilol 6.25 mg bid  Family Hx:  Mother with AF, and similar aneurysm as well; now 52  Father - no heart disease also 41  1 brother w/o problems  No kids  Social Hx:  No tobacco, quit 6-7 years ago; occasional alcohol; has cut back on caffeine - now mostly decaf tea.  Was previously drinking Pharmacologist drinks daily, has cut this out.   Diet:  Mix of home/out; adds some salt to taste; no dietary restraints, admits to liking his "white foods".   Exercise:  Not much - has home treadmill, elliptical but not using them (previously would run 10-20 miles when younger)  Home BP readings:  Wrist cuff at home - has not used much  Intolerances:   None   Wt Readings from Last 3 Encounters:  09/15/16 (!) 304 lb 3.2 oz (138 kg)  07/30/16 (!) 307 lb 3.2 oz (139.3 kg)  07/24/16 (!) 302 lb (137 kg)   BP Readings from Last 3 Encounters:  09/30/16 (!) 152/102  09/15/16 (!) 157/96  07/30/16 140/82   Pulse Readings from Last 3 Encounters:  09/30/16 84  09/15/16 93  07/30/16 91      Current Outpatient Prescriptions  Medication Sig Dispense Refill  . apixaban (ELIQUIS) 5 MG TABS tablet Take 1 tablet (5 mg total) by mouth 2 (two) times daily. 180 tablet 1  . BIOTIN 5000 PO Take 5,000 mcg by mouth daily.    . carvedilol (COREG) 12.5 MG tablet Take 1 tablet (12.5 mg total) by mouth 2 (two) times daily. 60 tablet 3   No current facility-administered medications for this visit.     No Known Allergies  Past Medical History:  Diagnosis Date  . Aneurysm of ascending aorta (HCC) 07/2016   4.4 cm  . 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  . Essential hypertension 09/15/2016  . Hypertension   . Obesity   . Paroxysmal atrial fibrillation (HCC)     Blood pressure (!) 152/102, pulse 84.  Standing 144/92, right arm 158/96  Essential hypertension Patient with essential hypertension, some of which he believes is attributable to white coat syndrome.  Will increase his carvedilol to 12.5 mg bid.  Explained the need for him to find his home BP cuff and take daily readings for the next several weeks.  He will bring those readings, along with his cuff, to a return appointment in 3-4 weeks.   At that time we can  better determine if there is some measure of white coat.  He was also strongly encouraged to dust off the treadmill and/or elliptical, and start doing some regular exercise.     Phillips Hay PharmD CPP [facility]

## 2016-10-01 ENCOUNTER — Encounter: Payer: Self-pay | Admitting: Pharmacist Clinician (PhC)/ Clinical Pharmacy Specialist

## 2016-10-01 ENCOUNTER — Ambulatory Visit (HOSPITAL_BASED_OUTPATIENT_CLINIC_OR_DEPARTMENT_OTHER): Payer: 59 | Attending: Cardiovascular Disease | Admitting: Cardiovascular Disease

## 2016-10-01 VITALS — Ht 72.0 in | Wt 299.0 lb

## 2016-10-01 DIAGNOSIS — I480 Paroxysmal atrial fibrillation: Secondary | ICD-10-CM

## 2016-10-01 DIAGNOSIS — I10 Essential (primary) hypertension: Secondary | ICD-10-CM

## 2016-10-01 DIAGNOSIS — R0683 Snoring: Secondary | ICD-10-CM

## 2016-10-01 DIAGNOSIS — G4733 Obstructive sleep apnea (adult) (pediatric): Secondary | ICD-10-CM | POA: Diagnosis not present

## 2016-10-01 NOTE — Assessment & Plan Note (Signed)
Patient with essential hypertension, some of which he believes is attributable to white coat syndrome.  Will increase his carvedilol to 12.5 mg bid.  Explained the need for him to find his home BP cuff and take daily readings for the next several weeks.  He will bring those readings, along with his cuff, to a return appointment in 3-4 weeks.   At that time we can better determine if there is some measure of white coat.  He was also strongly encouraged to dust off the treadmill and/or elliptical, and start doing some regular exercise.

## 2016-10-21 ENCOUNTER — Ambulatory Visit (INDEPENDENT_AMBULATORY_CARE_PROVIDER_SITE_OTHER): Payer: 59 | Admitting: Pharmacist Clinician (PhC)/ Clinical Pharmacy Specialist

## 2016-10-21 VITALS — BP 134/90 | HR 80

## 2016-10-21 DIAGNOSIS — I10 Essential (primary) hypertension: Secondary | ICD-10-CM | POA: Diagnosis not present

## 2016-10-21 MED ORDER — CHLORTHALIDONE 25 MG PO TABS: 25.0000 mg | ORAL_TABLET | Freq: Every day | ORAL | 6 refills | Status: DC

## 2016-10-21 NOTE — Assessment & Plan Note (Signed)
Patient with essential hypertension, diastolic readings still higher than I would like for someone of his age.  Will add chlorthalidone 25 mg once daily in addition to his carvedilol.   Patient will have BMET drawn in 2 weeks and return after a month for follow up.  He was encouraged to continue with daily home BP monitoring.
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
## 2016-10-21 NOTE — Progress Notes (Signed)
10/21/2016 [name] 05-Jul-1965 [id number]   HPI:  Kenneth Kirby is a 51 y.o. male patient of Dr Kenneth Kirby, with a PMH below who presents today for hypertension clinic evaluation.  He was in apparently good health until July, when he presented to the ER with AF with RVR.  He was cardioverted in ER and started on diltiazem 120 mg daily.  Soon afterward an echocardiogram showed grade 1 diastolic dysfunction and an ascending aortic aneurysm.   After this, his diltiazem was converted to carvedilol 6.25 mg bid.  At his last visit I increased that to 12. 5 mg twice daily   He did a home sleep study recently, but was asked to repeat it and has not been able to do so yet.    Blood Pressure Goal:  130/80  Current Medications:  Carvedilol 12.5 mg bid  Family Hx:  Mother with AF, and similar aneurysm as well; now 46  Father - no heart disease also 48  1 brother w/o problems  No kids  Social Hx:  No tobacco, quit 6-7 years ago; occasional alcohol; has cut back on caffeine - now mostly decaf tea.  Was previously drinking Pharmacologist drinks daily, has cut this out.   Diet:  Mix of home/out; adds some salt to taste; no dietary restraints, admits to liking his "white foods".   Exercise:  Not much - has home treadmill, elliptical but not using them (previously would run 10-20 miles when younger)  Home BP readings:  Wrist cuff at home - [employer] brand, home readings AM (18) avg 125/81;  PM (16) avg 127/82  Intolerances:   None   Wt Readings from Last 3 Encounters:  10/01/16 299 lb (135.6 kg)  09/15/16 (!) 304 lb 3.2 oz (138 kg)  07/30/16 (!) 307 lb 3.2 oz (139.3 kg)   BP Readings from Last 3 Encounters:  10/21/16 134/90  09/30/16 (!) 152/102  09/15/16 (!) 157/96   Pulse Readings from Last 3 Encounters:  10/21/16 80  09/30/16 84  09/15/16 93    Current Outpatient Prescriptions  Medication Sig Dispense Refill  . apixaban (ELIQUIS) 5 MG TABS tablet Take 1 tablet (5 mg total)  by mouth 2 (two) times daily. 180 tablet 1  . BIOTIN 5000 PO Take 5,000 mcg by mouth daily.    . carvedilol (COREG) 12.5 MG tablet Take 1 tablet (12.5 mg total) by mouth 2 (two) times daily. 60 tablet 3  . chlorthalidone (HYGROTON) 25 MG tablet Take 1 tablet (25 mg total) by mouth daily. 30 tablet 6   No current facility-administered medications for this visit.     No Known Allergies  Past Medical History:  Diagnosis Date  . Aneurysm of ascending aorta (HCC) 07/2016   4.4 cm  . 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  . Essential hypertension 09/15/2016  . Hypertension   . Obesity   . Paroxysmal atrial fibrillation (HCC)     Blood pressure 134/90, pulse 80.  Standing 144/92, right arm 158/96  Essential hypertension Patient with essential hypertension, diastolic readings still higher than I would like for someone of his age.  Will add chlorthalidone 25 mg once daily in addition to his carvedilol.   Patient will have BMET drawn in 2 weeks and return after a month for follow up.  He was encouraged to continue with daily home BP monitoring.     Kenneth Kirby PharmD CPP [facility]  

## 2016-12-09 ENCOUNTER — Encounter: Payer: Self-pay | Admitting: Cardiovascular Disease

## 2016-12-14 ENCOUNTER — Encounter: Payer: Self-pay | Admitting: Cardiovascular Disease

## 2016-12-14 MED ORDER — CARVEDILOL 12.5 MG PO TABS: 12.5000 mg | ORAL_TABLET | Freq: Two times a day (BID) | ORAL | 3 refills | Status: DC

## 2016-12-16 ENCOUNTER — Other Ambulatory Visit: Payer: Self-pay | Admitting: *Deleted

## 2016-12-16 DIAGNOSIS — I712 Thoracic aortic aneurysm, without rupture: Secondary | ICD-10-CM

## 2016-12-16 DIAGNOSIS — I7121 Aneurysm of the ascending aorta, without rupture: Secondary | ICD-10-CM

## 2017-01-03 ENCOUNTER — Telehealth: Payer: Self-pay | Admitting: Cardiovascular Disease

## 2017-01-03 NOTE — Telephone Encounter (Signed)
Called patient and LVM to call back to schedule his echo in 03-2017.

## 2017-01-23 ENCOUNTER — Encounter (HOSPITAL_BASED_OUTPATIENT_CLINIC_OR_DEPARTMENT_OTHER): Payer: Self-pay | Admitting: Cardiovascular Disease

## 2017-01-23 NOTE — Procedures (Addendum)
    Patient Name: Kenneth Kirby, Kenneth Kirby Study Date: 10/04/2016 Gender: Male D.O.B: 06-02-1965 Age (years): 51 Referring Provider: Chilton Siiffany Elgin Height (inches): 72 Interpreting Physician: Nicki Guadalajarahomas Dominie Benedick MD, ABSM Weight (lbs): 306 RPSGT: [place], [place] BMI: 27 MRN: [id number] Neck Size: 18.00  CLINICAL INFORMATION Sleep Study Type: HST  Indication for sleep study: Hypertension, Obesity, Snoring, Atrial Fibrillation  Epworth Sleepiness Score: 3  SLEEP STUDY TECHNIQUE A multi-channel overnight portable sleep study was performed. The channels recorded were: nasal airflow, thoracic respiratory movement, and oxygen saturation with a pulse oximetry. Snoring was also monitored.  MEDICATIONS     apixaban (ELIQUIS) 5 MG TABS tablet         BIOTIN 5000 PO         carvedilol (COREG) 12.5 MG tablet         chlorthalidone (HYGROTON) 25 MG tablet (Expired)      Patient self administered medications include: N/A.  SLEEP ARCHITECTURE Patient was studied for 354.0 minutes. The sleep efficiency was 99.9 % and the patient was supine for 99.9%. The arousal index was 0.0 per hour.  RESPIRATORY PARAMETERS The overall AHI was 26.4 per hour, with a central apnea index of 0.0 per hour.  The oxygen nadir was 76% during sleep. Time spent less than 89% saturation was 13.1 minutes  CARDIAC DATA Mean heart rate during sleep was 63.3 bpm.  IMPRESSIONS - Moderate obstructive sleep apnea occurred during this study (AHI = 26.4/h). - No significant central sleep apnea occurred during this study (CAI = 0.0/h). - Severe oxygen desaturation to a nadir of 76%. - Patient snored 72.7% during the sleep.  DIAGNOSIS - Obstructive Sleep Apnea (327.23 [G47.33 ICD-10]) - Nocturnal Hypoxemia (327.26 [G47.36 ICD-10])  RECOMMENDATIONS - In this patient with cardiovascular comorbidities, recommend an in-lab CPAP titration study to optimally treat sleep-disordered breathing and nocturnal oxygen desaturation. -  Effort should be made to optimize nasal and oral pharyngeal patency - Positional therapy avoiding supine position during sleep. - Avoid alcohol, sedatives and other CNS depressants that may worsen sleep apnea and disrupt normal sleep architecture. - Sleep hygiene should be reviewed to assess factors that may improve sleep quality. - Weight management and regular exercise should be initiated.    [Electronically signed] 01/23/2017 01:06 PM  Nicki Guadalajarahomas Carisha Kantor MD, [facility] HospitalFACC, ABSM Diplomate, American Board of Sleep Medicine   NPI: [id number]  [facility] PH: [phone]   FX: [phone] ACCREDITED BY THE AMERICAN ACADEMY OF SLEEP MEDICINE

## 2017-02-02 ENCOUNTER — Telehealth: Payer: Self-pay | Admitting: *Deleted

## 2017-02-02 DIAGNOSIS — G4733 Obstructive sleep apnea (adult) (pediatric): Secondary | ICD-10-CM

## 2017-02-02 DIAGNOSIS — I4891 Unspecified atrial fibrillation: Secondary | ICD-10-CM

## 2017-02-02 DIAGNOSIS — G4734 Idiopathic sleep related nonobstructive alveolar hypoventilation: Secondary | ICD-10-CM

## 2017-02-02 DIAGNOSIS — I10 Essential (primary) hypertension: Secondary | ICD-10-CM

## 2017-02-02 DIAGNOSIS — I719 Aortic aneurysm of unspecified site, without rupture: Secondary | ICD-10-CM

## 2017-02-02 NOTE — Telephone Encounter (Signed)
-----   Message from Lennette Biharihomas A Kelly, MD sent at 01/23/2017  1:32 PM EST ----- Lameisha Schuenemann  , please notify the patient the results of his home sleep study.  As you know, there was an inadvertent unassignment of this study , which led to the delay in interpretation from the initial study .  The patient will need a in lab CPAP titration.  If insurance denies this, home auto titration will be necessary.

## 2017-02-02 NOTE — Telephone Encounter (Signed)
Left message to call back  

## 2017-02-02 NOTE — Progress Notes (Signed)
lmtcb 1/23

## 2017-02-05 ENCOUNTER — Encounter: Payer: Self-pay | Admitting: Cardiovascular Disease

## 2017-02-07 ENCOUNTER — Other Ambulatory Visit: Payer: Self-pay | Admitting: *Deleted

## 2017-02-07 ENCOUNTER — Encounter: Payer: Self-pay | Admitting: Cardiovascular Disease

## 2017-02-07 MED ORDER — CARVEDILOL 12.5 MG PO TABS: 12.5000 mg | ORAL_TABLET | Freq: Two times a day (BID) | ORAL | 3 refills | Status: DC

## 2017-02-11 NOTE — Telephone Encounter (Signed)
Left message to call back  

## 2017-02-11 NOTE — Progress Notes (Signed)
lmtcb 2/1

## 2017-02-18 NOTE — Progress Notes (Signed)
See telephone note 2/8 

## 2017-02-18 NOTE — Telephone Encounter (Signed)
Spoke to patient, aware of results and verbalized understanding.   States that he had to do the HST two times, as they were unable to "read" the first set of data.       Patient concerned with insurance approving titration study.  Advised I would place order and sent to precert.  Patient aware and verbalized understanding.

## 2017-02-21 ENCOUNTER — Ambulatory Visit (HOSPITAL_COMMUNITY): Payer: 59 | Attending: Cardiology

## 2017-02-21 ENCOUNTER — Other Ambulatory Visit: Payer: Self-pay

## 2017-02-21 DIAGNOSIS — I7121 Aneurysm of the ascending aorta, without rupture: Secondary | ICD-10-CM

## 2017-02-21 DIAGNOSIS — I480 Paroxysmal atrial fibrillation: Secondary | ICD-10-CM | POA: Insufficient documentation

## 2017-02-21 DIAGNOSIS — I712 Thoracic aortic aneurysm, without rupture: Secondary | ICD-10-CM | POA: Diagnosis not present

## 2017-03-03 ENCOUNTER — Ambulatory Visit (INDEPENDENT_AMBULATORY_CARE_PROVIDER_SITE_OTHER): Payer: 59 | Admitting: Cardiology

## 2017-03-03 ENCOUNTER — Encounter: Payer: Self-pay | Admitting: Cardiology

## 2017-03-03 VITALS — BP 122/90 | HR 72 | Ht 73.0 in | Wt 306.2 lb

## 2017-03-03 DIAGNOSIS — I719 Aortic aneurysm of unspecified site, without rupture: Secondary | ICD-10-CM

## 2017-03-03 DIAGNOSIS — I480 Paroxysmal atrial fibrillation: Secondary | ICD-10-CM

## 2017-03-03 DIAGNOSIS — I10 Essential (primary) hypertension: Secondary | ICD-10-CM

## 2017-03-03 DIAGNOSIS — Z6841 Body Mass Index (BMI) 40.0 and over, adult: Secondary | ICD-10-CM | POA: Diagnosis not present

## 2017-03-03 NOTE — Progress Notes (Signed)
03/03/2017 Kenneth Kirby   January 20, 1965  [id number]  Primary Physician Patient, No Pcp Per Primary Cardiologist: Dr Duke Salvia  HPI:  52 y/o obese male, unemployed currently, here with his SO for follow up. The pt presented in July 2018 with AF with RVR. He was cardioverted in the ED to NSR. He admitted to using energy drinks. Echo showed grade 2 DD and normal LVF. 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 He had a sleep study in Sept but didn't get the results until jan 2019. He needs to have a C-pap study done and we are working on this. He remains in NSR. F/U echo this month showed no change in his AO- 4.4 cm. He has done well from a standpoint of his AF, no recurrence that he can tell.    Current Outpatient Medications  Medication Sig Dispense Refill  . apixaban (ELIQUIS) 5 MG TABS tablet Take 1 tablet (5 mg total) by mouth 2 (two) times daily. 180 tablet 1  . BIOTIN 5000 PO Take 5,000 mcg by mouth daily.    . carvedilol (COREG) 12.5 MG tablet Take 1 tablet (12.5 mg total) by mouth 2 (two) times daily. 180 tablet 3  . chlorthalidone (HYGROTON) 25 MG tablet Take 1 tablet (25 mg total) by mouth daily. 30 tablet 6   No current facility-administered medications for this visit.     No Known Allergies  Past Medical History:  Diagnosis Date  . Aneurysm of ascending aorta (HCC) 07/2016   4.4 cm  . 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  . Essential hypertension 09/15/2016  . Hypertension   . Obesity   . Paroxysmal atrial fibrillation (HCC)     Social History   Socioeconomic History  . Marital status: Single    Spouse name: Not on file  . Number of children: Not on file  . Years of education: Not on file  . Highest education level: Not on file  Social Needs  . Financial resource strain: Not on file  . Food insecurity - worry: Not on file  . Food insecurity - inability: Not on file  . Transportation needs - medical: Not on file  .  Transportation needs - non-medical: Not on file  Occupational History  . Not on file  Tobacco Use  . Smoking status: Never Smoker  . Smokeless tobacco: Never Used  Substance and Sexual Activity  . Alcohol use: Yes  . Drug use: No  . Sexual activity: Yes  Other Topics Concern  . Not on file  Social History Narrative  . Not on file     Family History  Problem Relation Age of Onset  . Graves' disease Mother   . Aortic aneurysm Mother   . Hypertension Mother   . Atrial fibrillation Mother   . Emphysema Maternal Grandmother   . Stomach cancer Paternal Grandmother      Review of Systems: General: negative for chills, fever, night sweats or weight changes.  Cardiovascular: negative for chest pain, dyspnea on exertion, edema, orthopnea, palpitations, paroxysmal nocturnal dyspnea or shortness of breath Dermatological: negative for rash Respiratory: negative for cough or wheezing Urologic: negative for hematuria Abdominal: negative for nausea, vomiting, diarrhea, bright red blood per rectum, melena, or hematemesis Neurologic: negative for visual changes, syncope, or dizziness All other systems reviewed and are otherwise negative except as noted above.    Blood pressure 122/90, pulse 72, height 6\' 1"  (1.854 m),  weight (!) 306 lb 3.2 oz (138.9 kg).  General appearance: alert, cooperative, no distress and morbidly obese Neck: no carotid bruit and no JVD Lungs: clear to auscultation bilaterally Heart: regular rate and rhythm Extremities: extremities normal, atraumatic, no cyanosis or edema Skin: Skin color, texture, turgor normal. No rashes or lesions Neurologic: Grossly normal  EKG NSR, incomplete RBBB- no change  ASSESSMENT AND PLAN:   PAF (paroxysmal atrial fibrillation) (HCC) Cardioverted in ED July 2018  Aneurysm of descending aorta (HCC) 4.4 cm on echo Feb 2019 (no change)  Essential hypertension B/P by me 124/84 with large cuff  Obesity BMI 40- sleep apnea on  his home study in Sept. He wasn't contacted until Jan 2019. He need study with C-pap which we are working on arranging.    PLAN  Same Rx- f/u 6 months with Dr Duke Salviaandolph. He knows to avoid any stimulant or OTC decongestants. I spoke with Burna MortimerWanda about his sleep study and she came and spoke to the patient as well.   Corine ShelterLuke Savayah Waltrip PA-C 03/03/2017 10:49 AM

## 2017-03-03 NOTE — Patient Instructions (Signed)
Medication Instructions:  Continue current medications  If you need a refill on your cardiac medications before your next appointment, please call your pharmacy.  Labwork: None Ordered  Testing/Procedures: None Ordered  Follow-Up: Your physician wants you to follow-up in: 6 Months with Dr [place]. You should receive a reminder letter in the mail two months in advance. If you do not receive a letter, please call our office [phone].    Thank you for choosing [facility]!!       

## 2017-03-03 NOTE — Assessment & Plan Note (Signed)
BMI 40- sleep apnea on his home study in Sept. He wasn't contacted until Jan 2019. He need study with C-pap which we are working on arranging.

## 2017-03-03 NOTE — Assessment & Plan Note (Signed)
Cardioverted in ED July 2018

## 2017-03-03 NOTE — Assessment & Plan Note (Signed)
B/P by me 124/84 with large cuff

## 2017-03-03 NOTE — Assessment & Plan Note (Signed)
4.4 cm on echo Feb 2019 (no change)

## 2017-03-04 ENCOUNTER — Telehealth: Payer: Self-pay | Admitting: *Deleted

## 2017-03-04 NOTE — Telephone Encounter (Signed)
Consulted with Dr Tresa Endokelly informing him that the patient told me yesterday while here seeing Corine ShelterLuke Kilroy for appointment that after March 5th he will no longer have health insurance coverage to pay for a CPAP machine. If this happens, then "ole well." Dr Tresa Endokelly was informed there is no way to get a CPAP titration study approval and machine set up by the March 5th date. I suggested to Dr Tresa EndoKelly to change the order to a home CPAP auto titration to obtain pressure settings and hopefully get patient approved by his insurance company in hopes of getting this done by the deadline. Dr Tresa EndoKelly was hesitant to do this, but agreed with this plan hopefully to get patient started on his therapy prior to losing insurance coverage. Patient was notified and is in agreement with the new plan to receive treatment for his diagnosed sleep apnea. Orders were faxed to [facility] for urgent processing.

## 2017-03-09 ENCOUNTER — Other Ambulatory Visit (HOSPITAL_COMMUNITY): Payer: 59

## 2017-03-21 ENCOUNTER — Other Ambulatory Visit: Payer: Self-pay | Admitting: *Deleted

## 2017-03-21 MED ORDER — APIXABAN 5 MG PO TABS: 5.0000 mg | ORAL_TABLET | Freq: Two times a day (BID) | ORAL | 3 refills | Status: DC

## 2017-03-21 NOTE — Telephone Encounter (Signed)
Rx has been sent to the pharmacy electronically. ° °

## 2017-03-23 ENCOUNTER — Ambulatory Visit: Payer: 59 | Admitting: Cardiovascular Disease

## 2017-03-23 ENCOUNTER — Encounter: Payer: Self-pay | Admitting: Cardiovascular Disease

## 2017-03-24 ENCOUNTER — Telehealth: Payer: Self-pay | Admitting: Cardiovascular Disease

## 2017-03-24 NOTE — Telephone Encounter (Signed)
New Message   Pt c/o medication issue:  1. Name of Medication: Eliquis   2. How are you currently taking this medication (dosage and times per day)? 5mg  two times a day   3. Are you having a reaction (difficulty breathing--STAT)? none  4. What is your medication issue?Patient states that the cost of the Eliquis with no insurance will be $600 for one month supply which he can not afford. He is requesting another medication.Or maybe something that can provide a substantial discount. In the meantime I did request some samples.     Patient calling the office for samples of medication:   1.  What medication and dosage are you requesting samples for? Eliquis 5mg   2.  Are you currently out of this medication? 2 remaining.

## 2017-03-25 NOTE — Telephone Encounter (Signed)
Follow up   Patient only has 1 dose remaining of Eliquis

## 2017-03-25 NOTE — Telephone Encounter (Signed)
lmtcb

## 2017-03-25 NOTE — Telephone Encounter (Signed)
Returned call to patient. Samples provided.   Patient has no insurance/prescription drug coverage. Gave information regarding patient assistance. Also gave copay card to use if (and when) he obtains insurance coverage in the near future.

## 2017-03-25 NOTE — Telephone Encounter (Signed)
F/U Call: ° °Patient returning call °

## 2017-04-08 ENCOUNTER — Encounter: Payer: Self-pay | Admitting: Cardiovascular Disease

## 2017-04-09 ENCOUNTER — Encounter: Payer: Self-pay | Admitting: Cardiovascular Disease

## 2017-04-12 ENCOUNTER — Encounter: Payer: Self-pay | Admitting: Cardiovascular Disease

## 2017-04-12 ENCOUNTER — Telehealth: Payer: Self-pay | Admitting: *Deleted

## 2017-04-12 MED ORDER — APIXABAN 5 MG PO TABS: 5.0000 mg | ORAL_TABLET | Freq: Two times a day (BID) | ORAL | 3 refills | Status: DC

## 2017-04-12 NOTE — Telephone Encounter (Signed)
Patient assistance for Eliquis filled out and will fax once Dr Duke Salviaandolph has signed

## 2017-04-12 NOTE — Telephone Encounter (Signed)
Eliquis 5 mg #3 Lot # [id number] 6/21 at front for pick up

## 2017-04-13 NOTE — Telephone Encounter (Signed)
Faxed patient assistance completed form

## 2017-04-19 ENCOUNTER — Encounter: Payer: Self-pay | Admitting: *Deleted

## 2017-04-19 NOTE — Telephone Encounter (Signed)
Received letter patient approved for patient assistance for Eliquis through 04/17/2018. Patient Assistance customer service number [phone]. Left patient message and sent mychart message

## 2017-05-06 ENCOUNTER — Other Ambulatory Visit: Payer: Self-pay

## 2017-05-06 MED ORDER — CHLORTHALIDONE 25 MG PO TABS: 25.0000 mg | ORAL_TABLET | Freq: Every day | ORAL | 6 refills | Status: DC

## 2017-11-18 MED ORDER — CHLORTHALIDONE 25 MG PO TABS: 25.0000 mg | ORAL_TABLET | Freq: Every day | ORAL | 1 refills | Status: DC

## 2017-11-26 IMAGING — CT CT ANGIO CHEST
1 of 3 series · 19 of 29 positions shown · IV contrast (isovue)
Comparison: Chest x-ray 07/24/2016

CLINICAL DATA: Newly diagnosed AAA.  New heart palpitations.

EXAM:
CT ANGIOGRAPHY CHEST WITH CONTRAST
TECHNIQUE: Multidetector CT imaging of the chest was performed using the
standard protocol during bolus administration of intravenous
contrast. Multiplanar CT image reconstructions and MIPs were
obtained to evaluate the vascular anatomy.
CONTRAST:  100 cc Isovue 370

[Series 4: aorta 3.0 i31f 2 · axial · 0.91mm/px · z∈[-387,-57]mm · 19 of 120 slices shown]
[im 5/120  lung]
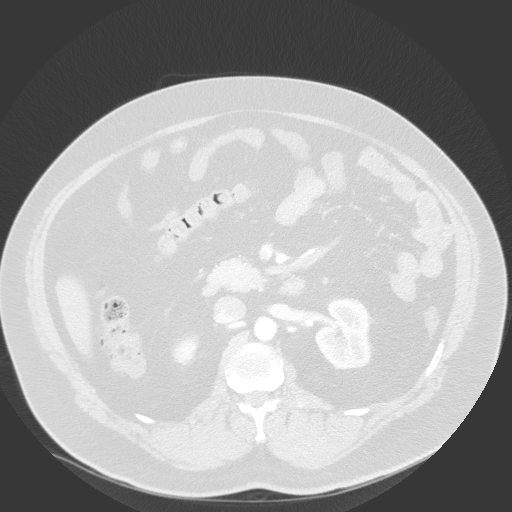
[im 10/120  mediastinal]
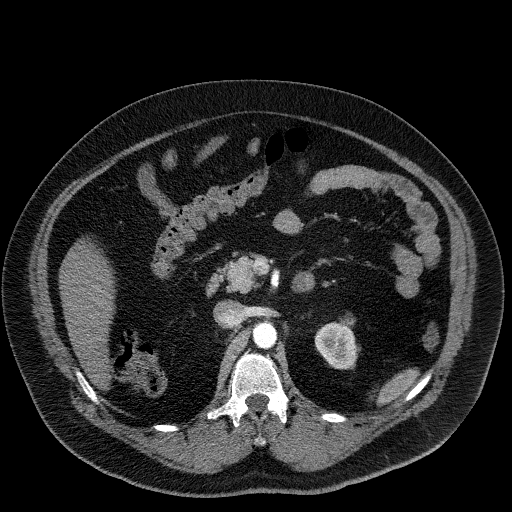
[im 20/120  lung]
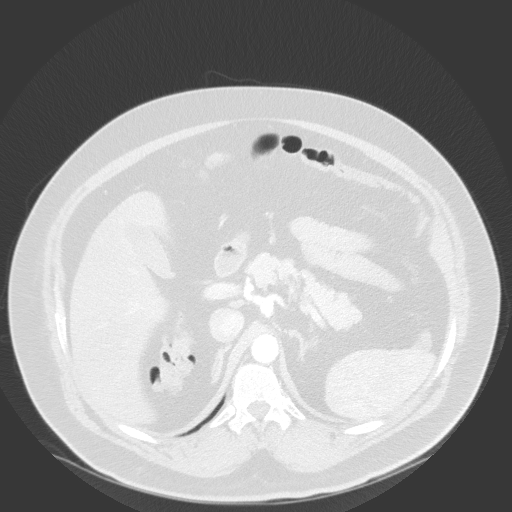
[im 25/120  mediastinal]
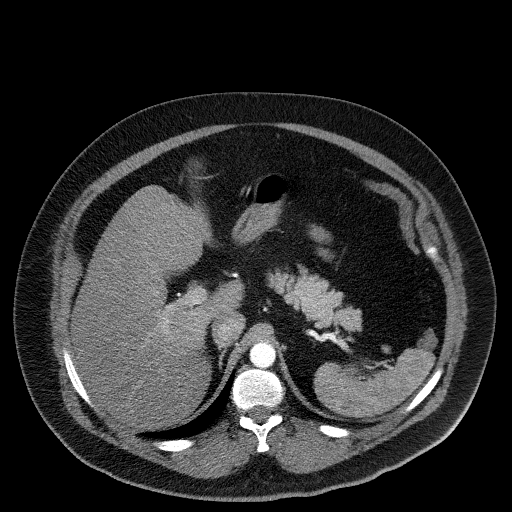
[im 30/120  lung]
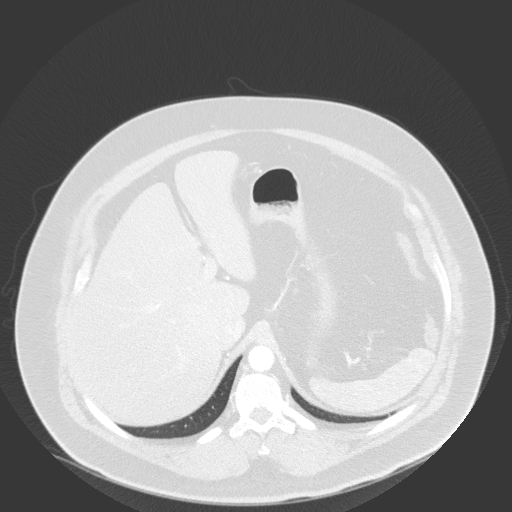
[im 35/120  mediastinal]
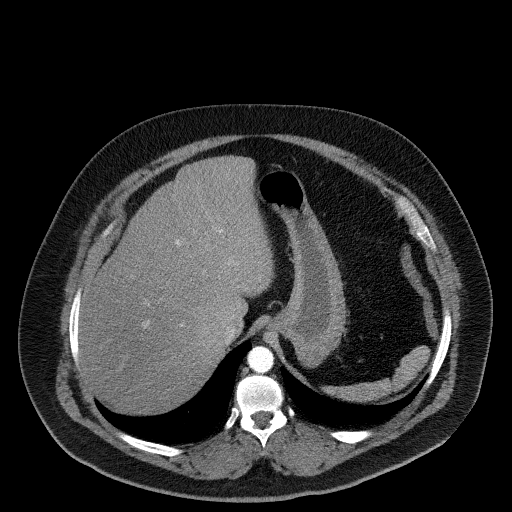
[im 45/120  lung]
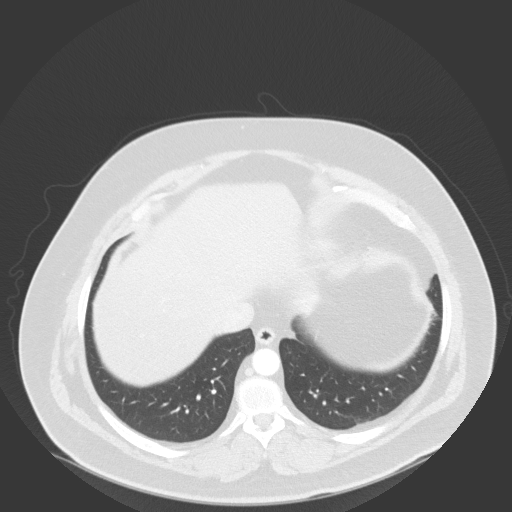
[im 50/120  mediastinal]
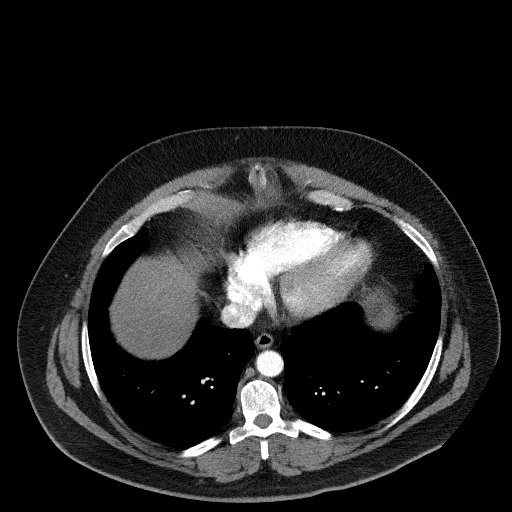
[im 55/120  lung]
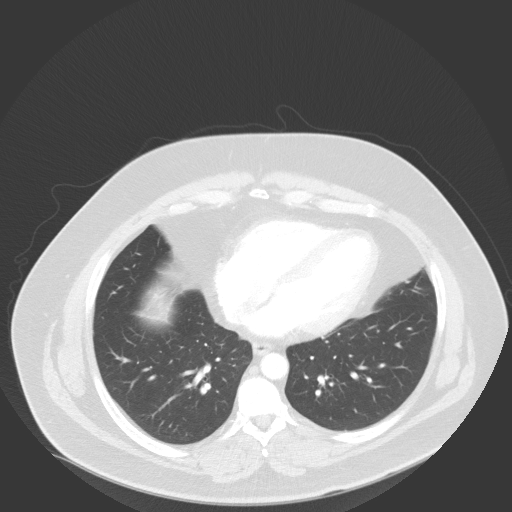
[im 60/120  mediastinal]
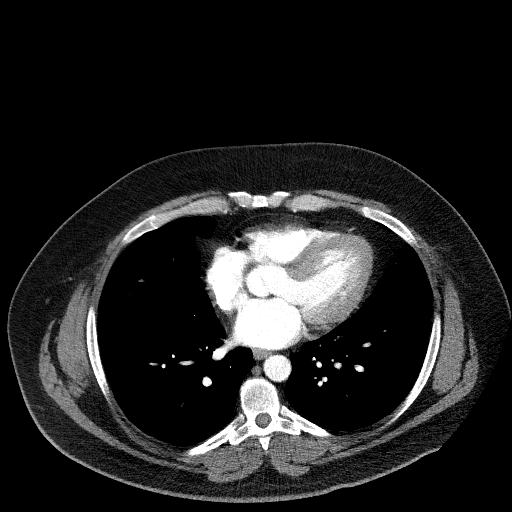
[im 65/120  lung]
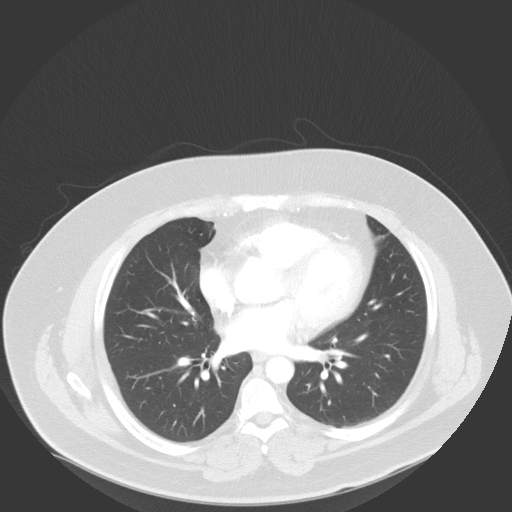
[im 70/120  mediastinal]
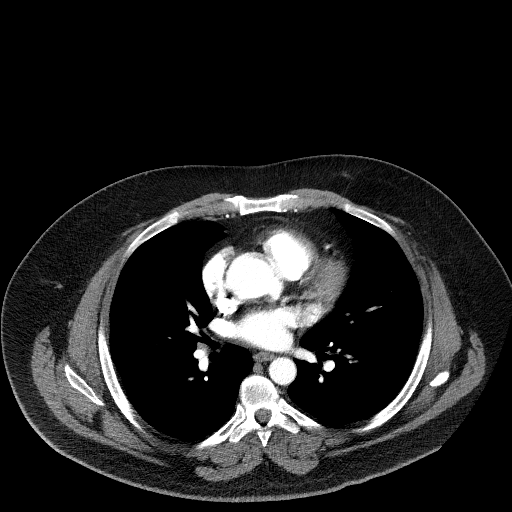
[im 75/120  lung]
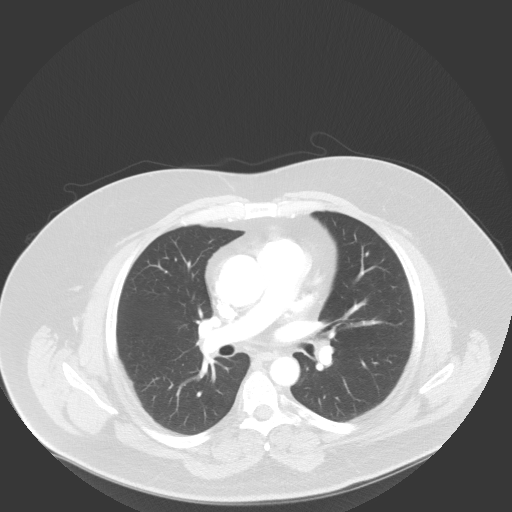
[im 85/120  mediastinal]
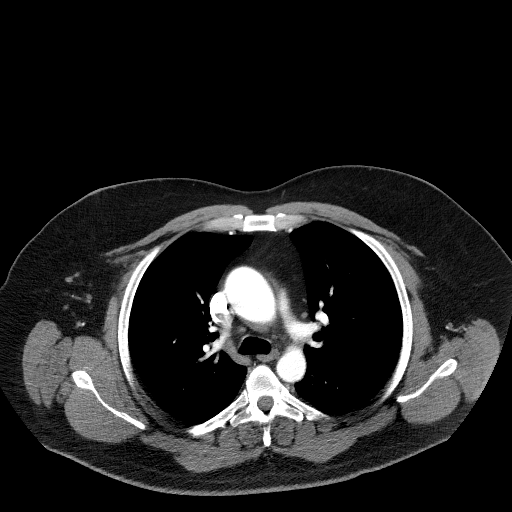
[im 90/120  lung]
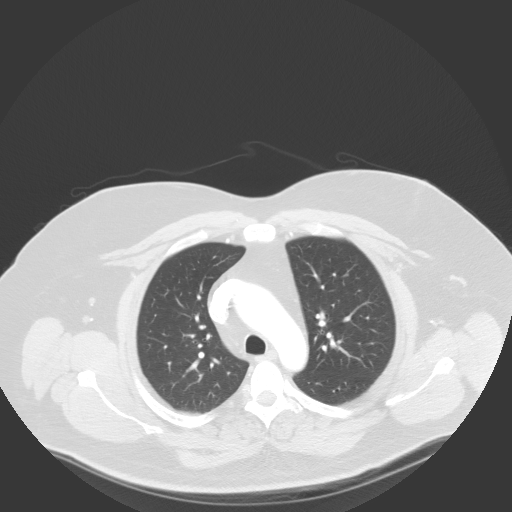
[im 95/120  mediastinal]
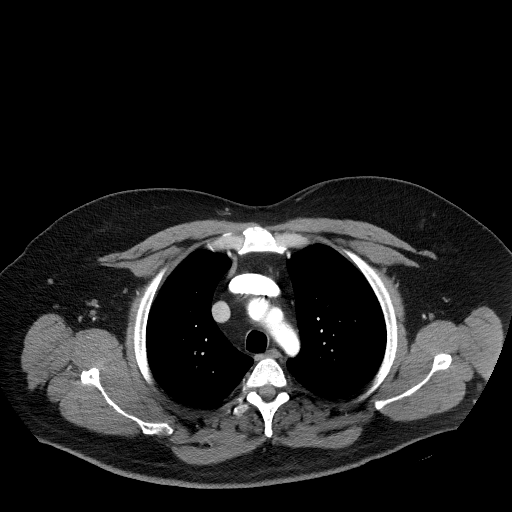
[im 100/120  lung]
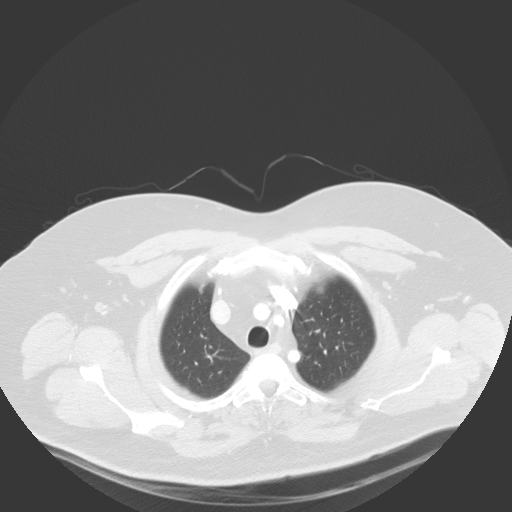
[im 110/120  mediastinal]
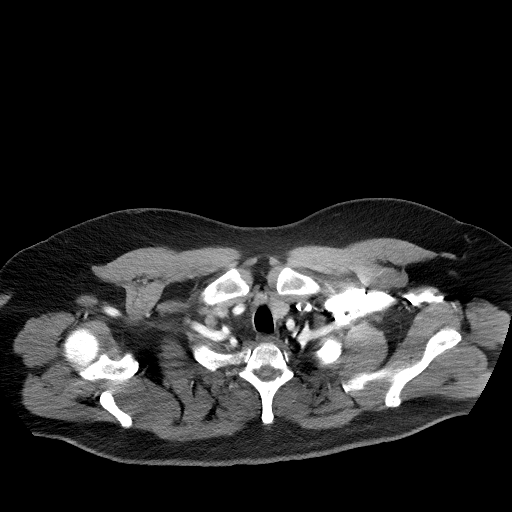
[im 115/120  lung]
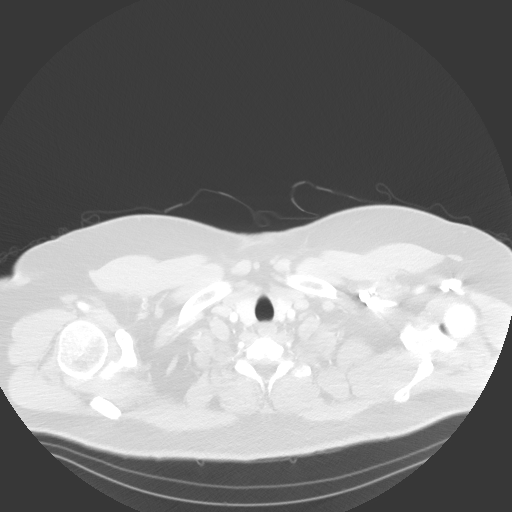

[19 of 29 positions shown; findings below may reference images not displayed]

FINDINGS: Cardiovascular: Heart size is normal. There is no significant
coronary artery calcification. No pericardial effusion. There is
enlargement of the ascending aorta, measuring 4.4 cm. The transverse
aortic arch is 3.1 cm. The descending aorta is normal. At the level
of the diaphragmatic hiatus, aorta is 2.2 cm. The appearance of the
pulmonary arteries is normal accounting for the contrast bolus
timing.

Mediastinum/Nodes: The visualized portion of the thyroid gland has a
normal appearance. No mediastinal, hilar, or axillary adenopathy.
Esophagus is normal in appearance.

Lungs/Pleura: Lungs are clear. No pleural effusion or pneumothorax.

Upper Abdomen: Visualized portion of the liver is diffusely low in
attenuation compatible with hepatic steatosis. Gallbladder is
present.

Musculoskeletal: No chest wall abnormality. No acute or significant
osseous findings.

Review of the MIP images confirms the above findings.
IMPRESSION: 1. Ascending aortic and aortic arch aneurysm, 4.4 cm and 3.1 cm
respectively. Aortic aneurysm NOS ([id number])
2. Recommend annual imaging followup by CTA or MRA. This
recommendation follows 5404
ACCF/AHA/AATS/ACR/ASA/SCA/TABE/GIORGI/OLEXJOE/GUYDONFLEURY Guidelines for the
Diagnosis and Management of Patients with Thoracic Aortic Disease.
Circulation. 5404; 121: [id number]
3. No significant coronary artery calcification.
4. Hepatic steatosis.

## 2017-12-12 ENCOUNTER — Other Ambulatory Visit: Payer: Self-pay | Admitting: Cardiovascular Disease

## 2017-12-12 ENCOUNTER — Other Ambulatory Visit: Payer: Self-pay

## 2017-12-12 MED ORDER — CARVEDILOL 12.5 MG PO TABS: 12.5000 mg | ORAL_TABLET | Freq: Two times a day (BID) | ORAL | 0 refills | Status: DC

## 2018-02-17 ENCOUNTER — Telehealth: Payer: Self-pay | Admitting: *Deleted

## 2018-02-17 NOTE — Telephone Encounter (Signed)
Received patient assistance forms for patients Eliquis that will need to be updated by 04/17/18. Patient is due for follow up and will need financial information. Left message to call back

## 2018-03-10 ENCOUNTER — Other Ambulatory Visit: Payer: Self-pay | Admitting: Cardiovascular Disease

## 2018-03-10 NOTE — Telephone Encounter (Signed)
Rx(s) sent to pharmacy electronically.  

## 2018-03-16 NOTE — Telephone Encounter (Signed)
Spoke with patient and scheduled follow up with [facility]. Patient will bring papers to that appointment or before.

## 2018-04-05 ENCOUNTER — Telehealth: Payer: Self-pay | Admitting: Cardiology

## 2018-04-05 NOTE — Telephone Encounter (Signed)
Left message for patient to contact office.

## 2018-04-05 NOTE — Telephone Encounter (Signed)
   Primary Cardiologist:  Dr Duke Salvia  Patient contacted.  History reviewed.  No symptoms to suggest any unstable cardiac conditions.  Based on discussion, with current pandemic situation, we will be postponing this appointment for [name] with a plan for f/u in 4-6 wks or sooner if feasible/necessary.  If symptoms change, he has been instructed to contact our office.    Kandice Robinsons T, CMA  04/05/2018 2:26 PM         .

## 2018-04-05 NOTE — Telephone Encounter (Signed)
New Message:   Pt wants to know if he is still supposed to see Franky Macho on Friday? He thought his appointment was canceled. He also said he had some paperwork from [facility] for patient assistance. He wants to know how can he get them to the office?

## 2018-04-05 NOTE — Telephone Encounter (Signed)
Encounter not needed

## 2018-04-07 ENCOUNTER — Ambulatory Visit: Payer: Self-pay | Admitting: Cardiology

## 2018-04-07 ENCOUNTER — Ambulatory Visit: Payer: 59 | Admitting: Cardiology

## 2018-04-12 ENCOUNTER — Telehealth: Payer: Self-pay | Admitting: Cardiovascular Disease

## 2018-04-14 ENCOUNTER — Other Ambulatory Visit: Payer: Self-pay | Admitting: Pharmacist

## 2018-04-14 MED ORDER — APIXABAN 5 MG PO TABS: 5.0000 mg | ORAL_TABLET | Freq: Two times a day (BID) | ORAL | 0 refills | Status: DC

## 2018-04-26 ENCOUNTER — Telehealth: Payer: Self-pay | Admitting: *Deleted

## 2018-04-26 NOTE — Telephone Encounter (Signed)
Notification received from [facility] application for patient assistance approved through 04/12/2019 for Eliquis

## 2018-04-27 NOTE — Telephone Encounter (Signed)
Great and that was fast. Did you already notified the patient or do I need to contact him.

## 2018-04-28 NOTE — Telephone Encounter (Signed)
Patient aware and stated medication already shipped

## 2018-05-01 ENCOUNTER — Telehealth: Payer: Self-pay

## 2018-05-01 ENCOUNTER — Telehealth (INDEPENDENT_AMBULATORY_CARE_PROVIDER_SITE_OTHER): Payer: Self-pay | Admitting: Cardiology

## 2018-05-01 ENCOUNTER — Encounter: Payer: Self-pay | Admitting: Cardiology

## 2018-05-01 VITALS — Ht 72.25 in | Wt 274.0 lb

## 2018-05-01 DIAGNOSIS — G4730 Sleep apnea, unspecified: Secondary | ICD-10-CM | POA: Insufficient documentation

## 2018-05-01 DIAGNOSIS — Z7901 Long term (current) use of anticoagulants: Secondary | ICD-10-CM

## 2018-05-01 DIAGNOSIS — E669 Obesity, unspecified: Secondary | ICD-10-CM

## 2018-05-01 DIAGNOSIS — I480 Paroxysmal atrial fibrillation: Secondary | ICD-10-CM

## 2018-05-01 DIAGNOSIS — Z6841 Body Mass Index (BMI) 40.0 and over, adult: Secondary | ICD-10-CM

## 2018-05-01 DIAGNOSIS — I10 Essential (primary) hypertension: Secondary | ICD-10-CM

## 2018-05-01 DIAGNOSIS — I719 Aortic aneurysm of unspecified site, without rupture: Secondary | ICD-10-CM

## 2018-05-01 MED ORDER — CHLORTHALIDONE 25 MG PO TABS: 25.0000 mg | ORAL_TABLET | Freq: Every day | ORAL | 3 refills | Status: DC

## 2018-05-01 MED ORDER — CARVEDILOL 12.5 MG PO TABS: 12.5000 mg | ORAL_TABLET | Freq: Two times a day (BID) | ORAL | 3 refills | Status: DC

## 2018-05-01 NOTE — Telephone Encounter (Signed)
Virtual Visit Pre-Appointment Phone Call  Steps For Call:  1. Confirm consent - "In the setting of the current Covid19 crisis, you are scheduled for a VIDEO visit with your provider on 05/01/2018 at 11AM.  Just as we do with many in-office visits, in order for you to participate in this visit, we must obtain consent.  If you'd like, I can send this to your mychart (if signed up) or email for you to review.  Otherwise, I can obtain your verbal consent now.  All virtual visits are billed to your insurance company just like a normal visit would be.  By agreeing to a virtual visit, we'd like you to understand that the technology does not allow for your provider to perform an examination, and thus may limit your provider's ability to fully assess your condition. If your provider identifies any concerns that need to be evaluated in person, we will make arrangements to do so.  Finally, though the technology is pretty good, we cannot assure that it will always work on either your or our end, and in the setting of a video visit, we may have to convert it to a phone-only visit.  In either situation, we cannot ensure that we have a secure connection.  Are you willing to proceed?" STAFF: Did the patient verbally acknowledge consent to telehealth visit? Document YES/NO here: YES  2. Confirm the BEST phone number to call the day of the visit by including in appointment notes  3. Give patient instructions for WebEx/MyChart download to smartphone as below or Doximity/Doxy.me if video visit (depending on what platform provider is using)  4. Advise patient to be prepared with their blood pressure, heart rate, weight, any heart rhythm information, their current medicines, and a piece of paper and pen handy for any instructions they may receive the day of their visit  5. Inform patient they will receive a phone call 15 minutes prior to their appointment time (may be from unknown caller ID) so they should be prepared to  answer  6. Confirm that appointment type is correct in Epic appointment notes (VIDEO vs PHONE)     TELEPHONE CALL NOTE  Kenneth Kirby has been deemed a candidate for a follow-up tele-health visit to limit community exposure during the Covid-19 pandemic. I spoke with the patient via phone to ensure availability of phone/video source, confirm preferred email & phone number, and discuss instructions and expectations.  I reminded Kenneth Kirby to be prepared with any vital sign and/or heart rhythm information that could potentially be obtained via home monitoring, at the time of his visit. I reminded Kenneth Kirby to expect a phone call at the time of his visit if his visit.  Lucita FerraraYoung, Dusty Wagoner T, CMA 05/01/2018 10:49 AM   INSTRUCTIONS FOR DOWNLOADING THE WEBEX APP TO SMARTPHONE  - If Apple, ask patient to go to [employer] Store and type in WebEx in the search bar. Download [employer] CorporationWebex Meetings, the blue/green circle. If Android, go to Universal Health[employer] Play Store and type in W[employer] W[employer]ley [employer] CompanyWebEx in the search bar. The app is free but as with any other app downloads, their phone may require them to verify saved payment information or Apple/Android password.  - The patient does NOT have to create an account. - On the day of the visit, the assist will walk the patient through joining the meeting with the meeting number/password.  INSTRUCTIONS FOR DOWNLOADING THE MYCHART APP TO SMARTPHONE  - The patient must first make sure to have activated  MyChart and know their login information - If Apple, go to [employer] and type in MyChart in the search bar and download the app. If Android, ask patient to go to [employer] and type in [place] in the search bar and download the app. The app is free but as with any other app downloads, their phone may require them to verify saved payment information or Apple/Android password.  - The patient will need to then log into the app with their MyChart username and password, and select [place] as their  healthcare provider to link the account. When it is time for your visit, go to the MyChart app, find appointments, and click Begin Video Visit. Be sure to Select Allow for your device to access the Microphone and Camera for your visit. You will then be connected, and your provider will be with you shortly.  **If they have any issues connecting, or need assistance please contact MyChart service desk [phone])**  **If using a computer, in order to ensure the best quality for their visit they will need to use either of the following Internet Browsers: [employer], or Google Chrome**  IF USING DOXIMITY or DOXY.ME - The patient will receive a link just prior to their visit, either by text or email (to be determined day of appointment depending on if it's doxy.me or Doximity).     FULL LENGTH CONSENT FOR TELE-HEALTH VISIT   I hereby voluntarily request, consent and authorize [facility] and its employed or contracted physicians, physician assistants, nurse practitioners or other licensed health care professionals (the Practitioner), to provide me with telemedicine health care services (the "Services") as deemed necessary by the treating Practitioner. I acknowledge and consent to receive the Services by the Practitioner via telemedicine. I understand that the telemedicine visit will involve communicating with the Practitioner through live audiovisual communication technology and the disclosure of certain medical information by electronic transmission. I acknowledge that I have been given the opportunity to request an in-person assessment or other available alternative prior to the telemedicine visit and am voluntarily participating in the telemedicine visit.  I understand that I have the right to withhold or withdraw my consent to the use of telemedicine in the course of my care at any time, without affecting my right to future care or treatment, and that the Practitioner or I may  terminate the telemedicine visit at any time. I understand that I have the right to inspect all information obtained and/or recorded in the course of the telemedicine visit and may receive copies of available information for a reasonable fee.  I understand that some of the potential risks of receiving the Services via telemedicine include:  Marland Kitchen Delay or interruption in medical evaluation due to technological equipment failure or disruption; . Information transmitted may not be sufficient (e.g. poor resolution of images) to allow for appropriate medical decision making by the Practitioner; and/or  . In rare instances, security protocols could fail, causing a breach of personal health information.  Furthermore, I acknowledge that it is my responsibility to provide information about my medical history, conditions and care that is complete and accurate to the best of my ability. I acknowledge that Practitioner's advice, recommendations, and/or decision may be based on factors not within their control, such as incomplete or inaccurate data provided by me or distortions of diagnostic images or specimens that may result from electronic transmissions. I understand that the practice of medicine is not an exact science and that Practitioner  makes no warranties or guarantees regarding treatment outcomes. I acknowledge that I will receive a copy of this consent concurrently upon execution via email to the email address I last provided but may also request a printed copy by calling the office of [facility].    I understand that my insurance will be billed for this visit.   I have read or had this consent read to me. . I understand the contents of this consent, which adequately explains the benefits and risks of the Services being provided via telemedicine.  . I have been provided ample opportunity to ask questions regarding this consent and the Services and have had my questions answered to my satisfaction. . I  give my informed consent for the services to be provided through the use of telemedicine in my medical care  By participating in this telemedicine visit I agree to the above.

## 2018-05-01 NOTE — Progress Notes (Signed)
Virtual Visit via Video Note   This visit type was conducted due to national recommendations for restrictions regarding the COVID-19 Pandemic (e.g. social distancing) in an effort to limit this patient's exposure and mitigate transmission in our community.  Due to his co-morbid illnesses, this patient is at least at moderate risk for complications without adequate follow up.  This format is felt to be most appropriate for this patient at this time.  All issues noted in this document were discussed and addressed.  A limited physical exam was performed with this format.  Please refer to the patient's chart for his consent to telehealth for [facility].  Evaluation Performed:  Follow-up visit  This visit type was conducted due to national recommendations for restrictions regarding the COVID-19 Pandemic (e.g. social distancing).  This format is felt to be most appropriate for this patient at this time.  All issues noted in this document were discussed and addressed.  No physical exam was performed (except for noted visual exam findings with Video Visits).  Please refer to the patient's chart (MyChart message for video visits and phone note for telephone visits) for the patient's consent to telehealth for [facility].  Date:  05/01/2018   ID:  Kenneth Kirby, DOB 11-24-1965, MRN [id number]  Patient Location: home  [address] [place] [address]   Provider location:   Home- [place]  PCP:  Patient, No Pcp Per  Cardiologist:  Dr Duke Salvia Electrophysiologist:  None   Chief Complaint:  One year OV  History of Present Illness:    Kenneth Kirby is a 53 y.o. male who presents via audio/video conferencing for a telehealth visit today.  The pt presented in July 2018 with AF with RVR. He was cardioverted in the ED to NSR. He admitted to using energy drinks. Echo then showed grade 2 DD and normal LVF. 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  He had a sleep study in Sept 2019  but didn't get the results until jan 2019.  He needs to have a C-pap study done and we are working on this. He is currently unemployed secondary to COVID.  F/U echo Feb 2019 showed no change in his AO- 4.4 cm.  He has done well from a standpoint of his AF, no recurrence that he can tell.  he is getting his Eliquis through [employer].  I will refill his Coreg and chlorthalidone.   The patient does not symptoms concerning for COVID-19 infection (fever, chills, cough, or new SHORTNESS OF BREATH).    Prior CV studies:   The following studies were reviewed today:  Past Medical History:  Diagnosis Date  . Aneurysm of ascending aorta (HCC) 07/2016   4.4 cm  . 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  . Essential hypertension 09/15/2016  . Hypertension   . Obesity   . Paroxysmal atrial fibrillation (HCC)    No past surgical history on file.   Current Meds  Medication Sig  . apixaban (ELIQUIS) 5 MG TABS tablet Take 1 tablet (5 mg total) by mouth 2 (two) times daily. Schedule cardiologist appointment prior to next refill  . BIOTIN 5000 PO Take 5,000 mcg by mouth daily.  . carvedilol (COREG) 12.5 MG tablet Take 1 tablet (12.5 mg total) by mouth 2 (two) times daily with a meal.  . chlorthalidone (HYGROTON) 25 MG tablet Take 1 tablet (25 mg total) by mouth daily.  . [DISCONTINUED]  carvedilol (COREG) 12.5 MG tablet Take 1 tablet (12.5 mg total) by mouth 2 (two) times daily with a meal. NEEDS APPOINTMENT FOR FUTURE REFILLS  . [DISCONTINUED] chlorthalidone (HYGROTON) 25 MG tablet Take 1 tablet (25 mg total) by mouth daily.     Allergies:   Patient has no known allergies.   Social History   Tobacco Use  . Smoking status: Never Smoker  . Smokeless tobacco: Never Used  Substance Use Topics  . Alcohol use: Yes  . Drug use: No     Family Hx: The patient's family history includes Aortic aneurysm in his mother; Atrial fibrillation in his mother; Emphysema in his  maternal grandmother; Luiz Blare' disease in his mother; Hypertension in his mother; Stomach cancer in his paternal grandmother.  ROS:   Please see the history of present illness.    All other systems reviewed and are negative.   Labs/Other Tests and Data Reviewed:    Recent Labs: No results found for requested labs within last 8760 hours.   Recent Lipid Panel No results found for: CHOL, TRIG, HDL, CHOLHDL, LDLCALC, LDLDIRECT  Wt Readings from Last 3 Encounters:  05/01/18 274 lb (124.3 kg)  03/03/17 (!) 306 lb 3.2 oz (138.9 kg)  10/01/16 299 lb (135.6 kg)     Exam:    Vital Signs:  Ht 6' 0.25" (1.835 m)   Wt 274 lb (124.3 kg)   BMI 36.90 kg/m    Overewight, well developed male in no acute distress. No obvious cyanosi   ASSESSMENT & PLAN:    PAF (paroxysmal atrial fibrillation) (HCC) Cardioverted in ED July 2018-holding NSR by his history  Aneurysm of descending aorta (HCC) 4.4 cm on echo Feb 2019 (no change)  Essential hypertension B/P per patient 113/78  Obesity BMI 40- sleep apnea on his home study in Sept. He wasn't contacted until Jan 2019. He need study with C-pap which we are working on arranging.   Chronic anticoagulation- CHADS VASC=1 but probably high risk for recurrent PAF with untreated sleep apnea.  COVID-19 Education: The signs and symptoms of COVID-19 were discussed with the patient and how to seek care for testing (follow up with PCP or arrange E-visit).  The importance of social distancing was discussed today.  Patient Risk:   After full review of this patients clinical status, I feel that they are at least moderate risk at this time.  Time:   Today, I have spent 15 minutes with the patient with telehealth technology discussing AF, sleep apnea, and his medications.     Medication Adjustments/Labs and Tests Ordered: Current medicines are reviewed at length with the patient today.  Concerns regarding medicines are outlined above.  Tests  Ordered: Orders Placed This Encounter  Procedures  . Basic Metabolic Panel (BMET)  . CBC  . ECHOCARDIOGRAM COMPLETE   Medication Changes: Meds ordered this encounter  Medications  . carvedilol (COREG) 12.5 MG tablet    Sig: Take 1 tablet (12.5 mg total) by mouth 2 (two) times daily with a meal.    Dispense:  180 tablet    Refill:  3  . chlorthalidone (HYGROTON) 25 MG tablet    Sig: Take 1 tablet (25 mg total) by mouth daily.    Dispense:  90 tablet    Refill:  3    Disposition:  because of his current financial situation I suggested we get his echo next year and see him back after that.  i refilled his medications and would like to check a BMP  and CBC (he has no PCP).   Jolene ProvostSigned, Rondel Episcopo, PA-C  05/01/2018 11:34 AM    [facility]

## 2018-05-01 NOTE — Patient Instructions (Signed)
Medication Instructions:  Your physician recommends that you continue on your current medications as directed. Please refer to the Current Medication list given to you today. If you need a refill on your cardiac medications before your next appointment, please call your pharmacy.   Lab work: Your physician recommends that you return for lab work in: WITHIN THE NEXT TWO WEEKS BMET, CBC  If you have labs (blood work) drawn today and your tests are completely normal, you will receive your results only by: Marland Kitchen MyChart Message (if you have MyChart) OR . A paper copy in the mail If you have any lab test that is abnormal or we need to change your treatment, we will call you to review the results.  Testing/Procedures: Your physician has requested that you have an echocardiogram. Echocardiography is a painless test that uses sound waves to create images of your heart. It provides your doctor with information about the size and shape of your heart and how well your heart's chambers and valves are working. This procedure takes approximately one hour. There are no restrictions for this procedure. PLEASE SCHEDULE ECHO FOR MARCH 2021  Follow-Up: At [facility], you and your health needs are our priority.  As part of our continuing mission to provide you with exceptional heart care, we have created designated Provider Care Teams.  These Care Teams include your primary Cardiologist (physician) and Advanced Practice Providers (APPs -  Physician Assistants and Nurse Practitioners) who all work together to provide you with the care you need, when you need it. You will need a follow up appointment in 12 months.  Please call our office 2 months in advance to schedule this appointment.  You may see Chilton Si, MD or one of the following Advanced Practice Providers on your designated Care Team:   Corine Shelter, PA-C Judy Pimple, [place] . Marjie Skiff, PA-C  Any Other Special Instructions Will Be Listed  Below (If Applicable).

## 2018-05-01 NOTE — Telephone Encounter (Signed)
Contacted patient to review AVS instructions. Patient voiced understanding. Patient request to have the sleep coordinator give him a call about his CPAP. Message sent to [facility].

## 2018-05-03 ENCOUNTER — Telehealth: Payer: Self-pay | Admitting: *Deleted

## 2018-05-03 NOTE — Telephone Encounter (Signed)
Left message to return a call. 

## 2018-05-04 NOTE — Telephone Encounter (Signed)
Patient returned a call. 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 I asked if he has a CPAP he was questioned and he said no. I asked him the reason why not because it was ordered urgently last year in February due to his insurance benefits scheduled to expire last March. He stated that he never heard from the DME company. I spoke with Hart Rochester @ [employer] and she informed me per the notes in their chart the patient was contacted and he declined treatment due to $. He told them he would call them back, however he never did. I told the patient there are no programs that will give him a machine whether it's new or used for free, the CPAP assistance charges $ 100.00 plus he has to pay for the mask and supplies. He states that at this time he doesn't have any money at all. He hasn't even received his first unemployment check. Patient informed that at this time there's nothing that can be done to get him a machine. If he can find a way to get at least some $ to apply to the CPAP assistance program I will be glad to submit the application for him. Patient voiced his understanding. He will call me back if his financial situation changes.

## 2019-01-30 ENCOUNTER — Ambulatory Visit: Payer: Self-pay | Attending: Internal Medicine

## 2019-01-30 DIAGNOSIS — Z20822 Contact with and (suspected) exposure to covid-19: Secondary | ICD-10-CM | POA: Insufficient documentation

## 2019-02-01 LAB — NOVEL CORONAVIRUS, NAA

## 2019-03-07 NOTE — Telephone Encounter (Signed)
ERROR

## 2019-03-27 ENCOUNTER — Encounter (HOSPITAL_COMMUNITY): Payer: Self-pay | Admitting: Cardiology

## 2019-04-10 ENCOUNTER — Telehealth (HOSPITAL_COMMUNITY): Payer: Self-pay | Admitting: Cardiology

## 2019-04-10 NOTE — Telephone Encounter (Signed)
Just an FYI. We have made several attempts to contact this patient including sending a letter to schedule or reschedule their echocardiogram. We will be removing the patient from the echo WQ.   03/27/2019 Mailed letter /LBW  03/23/19 [facility] to schedule @ 3:23 /LBW 03/20/19 [facility] to schedule @ 3:39 /LBW 03/19/19 [facility] to schedule @ 1:47/LBW   Thank you

## 2019-05-08 MED ORDER — APIXABAN 5 MG PO TABS: 5.0000 mg | ORAL_TABLET | Freq: Two times a day (BID) | ORAL | 0 refills | Status: DC

## 2019-05-08 MED ORDER — CARVEDILOL 12.5 MG PO TABS: 12.5000 mg | ORAL_TABLET | Freq: Two times a day (BID) | ORAL | 3 refills | Status: DC

## 2019-05-09 ENCOUNTER — Other Ambulatory Visit: Payer: Self-pay

## 2019-05-10 MED ORDER — CHLORTHALIDONE 25 MG PO TABS: 25.0000 mg | ORAL_TABLET | Freq: Every day | ORAL | 3 refills | Status: DC

## 2019-05-11 ENCOUNTER — Telehealth: Payer: Self-pay | Admitting: *Deleted

## 2019-05-11 MED ORDER — APIXABAN 5 MG PO TABS: 5.0000 mg | ORAL_TABLET | Freq: Two times a day (BID) | ORAL | 3 refills | Status: DC

## 2019-05-11 NOTE — Telephone Encounter (Signed)
Samples Eliquis 5 mg #4 Lot [id number] exp 3/23 Will fax patient assistance papers Dr Leonides Sake portion and patient will send his portion when filled out and completed

## 2019-05-18 NOTE — Telephone Encounter (Signed)
Faxed papers today

## 2019-05-29 NOTE — Telephone Encounter (Signed)
Spoke with patient assistance and patient has been approved through 05/27/2020 Advised patient

## 2019-07-24 ENCOUNTER — Ambulatory Visit: Payer: Self-pay | Admitting: Cardiovascular Disease

## 2019-09-05 ENCOUNTER — Ambulatory Visit: Payer: Self-pay | Admitting: Cardiovascular Disease

## 2019-09-18 ENCOUNTER — Other Ambulatory Visit: Payer: Self-pay

## 2019-09-18 ENCOUNTER — Encounter: Payer: Self-pay | Admitting: Cardiovascular Disease

## 2019-09-18 ENCOUNTER — Ambulatory Visit (INDEPENDENT_AMBULATORY_CARE_PROVIDER_SITE_OTHER): Payer: Self-pay | Admitting: Cardiovascular Disease

## 2019-09-18 VITALS — BP 126/94 | HR 72 | Ht 73.5 in | Wt 269.8 lb

## 2019-09-18 DIAGNOSIS — Z5181 Encounter for therapeutic drug level monitoring: Secondary | ICD-10-CM

## 2019-09-18 DIAGNOSIS — I719 Aortic aneurysm of unspecified site, without rupture: Secondary | ICD-10-CM

## 2019-09-18 DIAGNOSIS — I10 Essential (primary) hypertension: Secondary | ICD-10-CM

## 2019-09-18 DIAGNOSIS — I480 Paroxysmal atrial fibrillation: Secondary | ICD-10-CM

## 2019-09-18 NOTE — Patient Instructions (Signed)
Medication Instructions:  Your physician recommends that you continue on your current medications as directed. Please refer to the Current Medication list given to you today.  *If you need a refill on your cardiac medications before your next appointment, please call your pharmacy*  Lab Work: BMET/CBC SOON   If you have labs (blood work) drawn today and your tests are completely normal, you will receive your results only by: Marland Kitchen MyChart Message (if you have MyChart) OR . A paper copy in the mail If you have any lab test that is abnormal or we need to change your treatment, we will call you to review the results.  Testing/Procedures: Your physician has requested that you have an echocardiogram. Echocardiography is a painless test that uses sound waves to create images of your heart. It provides your doctor with information about the size and shape of your heart and how well your heart's chambers and valves are working. This procedure takes approximately one hour. There are no restrictions for this procedure.  Follow-Up: At [facility], you and your health needs are our priority.  As part of our continuing mission to provide you with exceptional heart care, we have created designated Provider Care Teams.  These Care Teams include your primary Cardiologist (physician) and Advanced Practice Providers (APPs -  Physician Assistants and Nurse Practitioners) who all work together to provide you with the care you need, when you need it.  We recommend signing up for the patient portal called "MyChart".  Sign up information is provided on this After Visit Summary.  MyChart is used to connect with patients for Virtual Visits (Telemedicine).  Patients are able to view lab/test results, encounter notes, upcoming appointments, etc.  Non-urgent messages can be sent to your provider as well.   To learn more about what you can do with MyChart, go to [email or web address].    Your next appointment:   12  month(s)  You will receive a reminder letter in the mail two months in advance. If you don't receive a letter, please call our office to schedule the follow-up appointment.  The format for your next appointment:   In Person  Provider:   You may see Chilton Si, MD or one of the following Advanced Practice Providers on your designated Care Team:    Corine Shelter, PA-C  [place], [place]  Edd Fabian, [place]

## 2019-09-18 NOTE — Progress Notes (Signed)
Cardiology Office Note  Date:  09/18/2019   ID:  Kenneth Kirby, DOB 10/20/1965, MRN [id number]  PCP:  Patient, No Pcp Per  Cardiologist:   Chilton Si, MD   No chief complaint on file.    History of Present Illness: Kenneth Kirby is a 54 y.o. male with paroxysmal atrial fibrillation who presents for follow up.  He was seen at [facility] 07/2016 with new onset atrial fibrillation. He presented to the ED where he was found to be in afib with RVR (HR 180's). He did not respond to adenosine x 2, but did respond to diltiazem push + gtt. He underwent cardioversion in the emergency department. He was started on diltiazem 120 mg daily and apixaban 5 mg twice daily. He was referred for an echo 08/10/16 that revealed LVEF 55-60%and grade 1 diastolic dysfunction. The left atrium was normal in size.  He ws also noted to have an ascending aortic aneurysm (4.4 cm).  Of note, his mother has an ascending aortic aneurysm that has been stable.  Kenneth Kirby recently moved to [facility] so he has been active with packing and unpacking.  He is not getting any other formal exercise.  He was working out more prior to moving.  He also had COVID-19 three months ago.  He had mild symptoms for a week.  His wife was sick for 3.5 weeks but recovered.  He hasn't experienced any chest pain and his breathing has been high.  His BP at home has been on the high end of normal.  He thinks he has some white coat hypertension.  He feels better since being on a diuretic.  He is still working to get insurance.  He does not think he has had any recurrent atrial fibrillation and is limiting caffeine intake.   Past Medical History:  Diagnosis Date   Aneurysm of ascending aorta (HCC) 07/2016   4.4 cm   Aneurysm of descending aorta (HCC) 09/15/2016   4.4 cm by echo 08/2016.   Essential hypertension 09/15/2016   Hypertension    Obesity    Paroxysmal atrial fibrillation (HCC)     No past surgical history on file.   Current  Outpatient Medications  Medication Sig Dispense Refill   apixaban (ELIQUIS) 5 MG TABS tablet Take 1 tablet (5 mg total) by mouth 2 (two) times daily. 180 tablet 3   BIOTIN 5000 PO Take 5,000 mcg by mouth daily.     carvedilol (COREG) 12.5 MG tablet Take 1 tablet (12.5 mg total) by mouth 2 (two) times daily with a meal. 180 tablet 3   chlorthalidone (HYGROTON) 25 MG tablet Take 1 tablet (25 mg total) by mouth daily. 90 tablet 3   No current facility-administered medications for this visit.    Allergies:   Patient has no known allergies.    Social History:  The patient  reports that he has never smoked. He has never used smokeless tobacco. He reports current alcohol use. He reports that he does not use drugs.   Family History:  The patient's family history includes Aortic aneurysm in his mother; Atrial fibrillation in his mother; Emphysema in his maternal grandmother; Luiz Blare' disease in his mother; Hypertension in his mother; Stomach cancer in his paternal grandmother.    ROS:  Please see the history of present illness.   Otherwise, review of systems are positive for none.   All other systems are reviewed and negative.    PHYSICAL EXAM: VS:  BP (!) 126/94  Pulse 72    Ht 6' 1.5" (1.867 m)    Wt 269 lb 12.8 oz (122.4 kg)    SpO2 96%    BMI 35.11 kg/m  , BMI Body mass index is 35.11 kg/m. GENERAL:  Well appearing HEENT: Pupils equal round and reactive, fundi not visualized, oral mucosa unremarkable NECK:  No jugular venous distention, waveform within normal limits, carotid upstroke brisk and symmetric, no bruits LUNGS:  Clear to auscultation bilaterally HEART:  RRR.  PMI not displaced or sustained,S1 and S2 within normal limits, no S3, no S4, no clicks, no rubs, no murmurs ABD:  Flat, positive bowel sounds normal in frequency in pitch, no bruits, no rebound, no guarding, no midline pulsatile mass, no hepatomegaly, no splenomegaly EXT:  2 plus pulses throughout, no edema, no cyanosis  no clubbing SKIN:  No rashes no nodules NEURO:  Cranial nerves II through XII grossly intact, motor grossly intact throughout PSYCH:  Cognitively intact, oriented to person place and time   EKG:  EKG is ordered today. The ekg ordered  07/30/16 demonstrates sinus rhythm. Rate 91 bpm. Incomplete right bundle branch block. 09/18/2019: Sinus rhythm.  Rate 72 bpm.  Incomplete RBBB  Echo 08/10/16: Study Conclusions  - Left ventricle: The cavity size was normal. There was mild focal   basal hypertrophy of the septum. Systolic function was normal.   The estimated ejection fraction was in the range of 55% to 60%.   Wall motion was normal; there were no regional wall motion   abnormalities. Doppler parameters are consistent with abnormal   left ventricular relaxation (grade 1 diastolic dysfunction). - Aortic root: The aortic root was mildly dilated. - Ascending aorta: The ascending aorta was mildly dilated.  Impressions:  - Definity used; normal LV systolic function; mild diastolic   dysfunction; mildly dilated ascending aorta (4.4 cm); suggest CTA   to further assess.   Recent Labs: No results found for requested labs within last 8760 hours.    Lipid Panel No results found for: CHOL, TRIG, HDL, CHOLHDL, VLDL, LDLCALC, LDLDIRECT    Wt Readings from Last 3 Encounters:  09/18/19 269 lb 12.8 oz (122.4 kg)  05/01/18 274 lb (124.3 kg)  03/03/17 (!) 306 lb 3.2 oz (138.9 kg)      ASSESSMENT AND PLAN:  # Paroxysmal atrial fibrillation: No recurrent episodes since DCCV.  He had fatigue on diltiazem and we now know he has an ascending aorta aneurysm.  Continue carvedilol and Eliquis.  This patients CHA2DS2-VASc Score and unadjusted Ischemic Stroke Rate (% per year) is equal to 0.6 % stroke rate/year from a score of 1 Above score calculated as 1 point each if present [CHF, HTN, DM, Vascular=MI/PAD/Aortic Plaque, Age if 65-74, or Male] Above score calculated as 2 points each if present  [Age > 75, or Stroke/TIA/TE]  # Ascending aorta aneurysm:  4.4 cm by echo in 2019.  Repeat echo.  # Morbid obesity: He was congratulated on his weight loss.  Encouraged to start back working out.  Current medicines are reviewed at length with the patient today.  The patient does not have concerns regarding medicines.  The following changes have been made:  Switch diltiazem to metoprolol.  Labs/ tests ordered today include:   Orders Placed This Encounter  Procedures   CBC with Differential/Platelet   Basic metabolic panel   EKG 12-Lead   ECHOCARDIOGRAM COMPLETE     Disposition:   FU with Karrissa Parchment C. Duke Salvia, MD, [facility] in 1 year.   This  note was written with the assistance of speech recognition software.  Please excuse any transcriptional errors.  Signed, Valbona Slabach C. Duke Salvia, MD, [facility]  09/18/2019 5:25 PM    [facility]

## 2019-10-02 LAB — BASIC METABOLIC PANEL WITH GFR
BUN/Creatinine Ratio: 15 (ref 9–20)
BUN: 15 mg/dL (ref 6–24)
CO2: 28 mmol/L (ref 20–29)
Calcium: 9.6 mg/dL (ref 8.7–10.2)
Chloride: 101 mmol/L (ref 96–106)
Creatinine, Ser: 0.99 mg/dL (ref 0.76–1.27)
GFR calc Af Amer: 101 mL/min/1.73
GFR calc non Af Amer: 87 mL/min/1.73
Glucose: 97 mg/dL (ref 65–99)
Potassium: 4.3 mmol/L (ref 3.5–5.2)
Sodium: 140 mmol/L (ref 134–144)

## 2019-11-21 ENCOUNTER — Encounter: Payer: Self-pay | Admitting: *Deleted

## 2019-11-29 ENCOUNTER — Other Ambulatory Visit (HOSPITAL_COMMUNITY): Payer: Self-pay

## 2020-02-22 ENCOUNTER — Other Ambulatory Visit: Payer: Self-pay | Admitting: Cardiovascular Disease

## 2020-04-03 ENCOUNTER — Other Ambulatory Visit: Payer: Self-pay | Admitting: Cardiovascular Disease

## 2020-04-23 ENCOUNTER — Encounter: Payer: Self-pay | Admitting: *Deleted

## 2020-05-05 ENCOUNTER — Other Ambulatory Visit: Payer: Self-pay

## 2020-05-05 MED ORDER — APIXABAN 5 MG PO TABS: 5.0000 mg | ORAL_TABLET | Freq: Two times a day (BID) | ORAL | 1 refills | Status: DC

## 2020-05-05 NOTE — Telephone Encounter (Signed)
58m, 122.4kg, scr 0.99 10/02/19, lovw/[place] 09/18/19

## 2020-05-08 ENCOUNTER — Other Ambulatory Visit: Payer: Self-pay | Admitting: *Deleted

## 2020-05-08 MED ORDER — APIXABAN 5 MG PO TABS
5.0000 mg | ORAL_TABLET | Freq: Two times a day (BID) | ORAL | 1 refills | Status: DC
Start: 2020-05-08 — End: 2020-11-12

## 2020-11-12 ENCOUNTER — Other Ambulatory Visit (HOSPITAL_BASED_OUTPATIENT_CLINIC_OR_DEPARTMENT_OTHER): Payer: Self-pay | Admitting: Cardiovascular Disease

## 2020-11-12 DIAGNOSIS — I480 Paroxysmal atrial fibrillation: Secondary | ICD-10-CM

## 2020-11-12 NOTE — Telephone Encounter (Signed)
Routing to pharmd pool for permission to order labs  Prescription refill request for Eliquis received. Indication:afib Last office visit:[facility] 09/18/19 overdue  Scr:0.990 mg/ 10/02/2019 overdue  Age: 40m IAXKPV:374.8OL

## 2020-11-12 NOTE — Telephone Encounter (Signed)
Received fax from [facility] requesting refill for Eliquis 5 mg BID.

## 2020-11-18 ENCOUNTER — Other Ambulatory Visit: Payer: Self-pay

## 2020-11-18 MED ORDER — APIXABAN 5 MG PO TABS: 5.0000 mg | ORAL_TABLET | Freq: Two times a day (BID) | ORAL | 1 refills | Status: DC

## 2020-11-18 MED ORDER — APIXABAN 5 MG PO TABS
5.0000 mg | ORAL_TABLET | Freq: Two times a day (BID) | ORAL | 1 refills | Status: DC
Start: 2020-11-18 — End: 2020-11-18

## 2020-11-18 NOTE — Telephone Encounter (Signed)
Called and lmomed pt to complete labs and to call and clarify pharmacy as they were previously getting from pt assistance

## 2020-11-18 NOTE — Telephone Encounter (Signed)
Please place order for BMET and CBC for annual DOAC monitoring. Not sure why refill request came through, message from April shows pt is in pt assistance program and getting Eliquis from the manufacturer since he's uninsured. Can clarify if he actually needs refill from pharmacy when he's called about needing labs,

## 2020-12-22 ENCOUNTER — Ambulatory Visit (INDEPENDENT_AMBULATORY_CARE_PROVIDER_SITE_OTHER): Payer: Self-pay | Admitting: Cardiovascular Disease

## 2020-12-22 ENCOUNTER — Encounter (HOSPITAL_BASED_OUTPATIENT_CLINIC_OR_DEPARTMENT_OTHER): Payer: Self-pay | Admitting: Cardiovascular Disease

## 2020-12-22 ENCOUNTER — Other Ambulatory Visit: Payer: Self-pay

## 2020-12-22 VITALS — BP 128/94 | HR 77 | Ht 73.5 in | Wt 306.8 lb

## 2020-12-22 DIAGNOSIS — I719 Aortic aneurysm of unspecified site, without rupture: Secondary | ICD-10-CM

## 2020-12-22 DIAGNOSIS — I480 Paroxysmal atrial fibrillation: Secondary | ICD-10-CM

## 2020-12-22 DIAGNOSIS — Z5181 Encounter for therapeutic drug level monitoring: Secondary | ICD-10-CM

## 2020-12-22 DIAGNOSIS — I10 Essential (primary) hypertension: Secondary | ICD-10-CM

## 2020-12-22 DIAGNOSIS — Z6839 Body mass index (BMI) 39.0-39.9, adult: Secondary | ICD-10-CM

## 2020-12-22 NOTE — Assessment & Plan Note (Signed)
Ascending aortic aneurysm.  He is due for repeat echo.  Continue beta-blocker.  Blood pressures well have been controlled.

## 2020-12-22 NOTE — Addendum Note (Signed)
Addended by: Regis Bill B on: 12/22/2020 11:02 AM   Modules accepted: Orders

## 2020-12-22 NOTE — Progress Notes (Signed)
Cardiology Office Note  Date:  12/22/2020   ID:  Kenneth Kirby, DOB 02-22-1965, MRN [id number]  PCP:  Patient, No Pcp Per (Inactive)  Cardiologist:   Chilton Si, MD   No chief complaint on file.    History of Present Illness: Kenneth Kirby is a 55 y.o. male with paroxysmal atrial fibrillation and hypertension who presents for follow up.  He was seen at [facility] 07/2016 with new onset atrial fibrillation. He presented to the ED where he was found to be in afib with RVR (HR 180's). He did not respond to adenosine x 2, but did respond to diltiazem push + gtt. He underwent cardioversion in the emergency department. He was started on diltiazem 120 mg daily and apixaban 5 mg twice daily. He was referred for an echo 08/10/16 that revealed LVEF 55-60%and grade 1 diastolic dysfunction. The left atrium was normal in size.  He ws also noted to have an ascending aortic aneurysm (4.4 cm).  Of note, his mother has an ascending aortic aneurysm that has been stable. At his last appointment, he was doing well. Repeat echo was ordered for surveillance of his aortic aneurysm but was not performed.  Today, he is doing well. He gained 20lbs due to COVID. Previously, he had lost 80lbs due to diet changes. To lose the weight, he plans to try a high-protein and vegetable diet alongside his partner. He has not had a recent Afib episode. At home, his blood pressure is usually 5-10 points lower than in clinic. For exercise, he walks 3 to 4 times a week. He denies any palpitations, chest pain, or shortness of breath, lightheadedness, headaches, syncope, orthopnea, PND, lower extremity edema or exertional symptoms.   Past Medical History:  Diagnosis Date   Aneurysm of ascending aorta 07/2016   4.4 cm   Aneurysm of descending aorta (HCC) 09/15/2016   4.4 cm by echo 08/2016.   Essential hypertension 09/15/2016   Hypertension    Obesity    Paroxysmal atrial fibrillation (HCC)     History reviewed. No pertinent  surgical history.   Current Outpatient Medications  Medication Sig Dispense Refill   apixaban (ELIQUIS) 5 MG TABS tablet Take 1 tablet (5 mg total) by mouth 2 (two) times daily. 180 tablet 1   BIOTIN 5000 PO Take 5,000 mcg by mouth daily.     carvedilol (COREG) 12.5 MG tablet TAKE 1 TABLET(12.5 MG) BY MOUTH TWICE DAILY WITH A MEAL 180 tablet 2   chlorthalidone (HYGROTON) 25 MG tablet TAKE 1 TABLET(25 MG) BY MOUTH DAILY 90 tablet 3   No current facility-administered medications for this visit.    Allergies:   Patient has no known allergies.    Social History:  The patient  reports that he has never smoked. He has never used smokeless tobacco. He reports current alcohol use. He reports that he does not use drugs.   Family History:  The patient's family history includes Aortic aneurysm in his mother; Atrial fibrillation in his mother; Emphysema in his maternal grandmother; Luiz Blare' disease in his mother; Hypertension in his mother; Stomach cancer in his paternal grandmother.    ROS:  Please see the history of present illness.   (+) Weight gain All other systems are reviewed and negative.    PHYSICAL EXAM: VS:  BP (!) 128/94 (BP Location: Left Arm, Patient Position: Sitting, Cuff Size: Large)   Pulse 77   Ht 6' 1.5" (1.867 m)   Wt (!) 306 lb 12.8 oz (139.2 kg)  BMI 39.93 kg/m  , BMI Body mass index is 39.93 kg/m. GENERAL:  Well appearing HEENT: Pupils equal round and reactive, fundi not visualized, oral mucosa unremarkable NECK:  No jugular venous distention, waveform within normal limits, carotid upstroke brisk and symmetric, no bruits, no thyromegaly LUNGS:  Clear to auscultation bilaterally HEART:  RRR.  PMI not displaced or sustained,S1 and S2 within normal limits, no S3, no S4, no clicks, no rubs, no murmurs ABD:  Flat, positive bowel sounds normal in frequency in pitch, no bruits, no rebound, no guarding, no midline pulsatile mass, no hepatomegaly, no splenomegaly EXT:  2  plus pulses throughout, no edema, no cyanosis no clubbing SKIN:  No rashes no nodules NEURO:  Cranial nerves II through XII grossly intact, motor grossly intact throughout PSYCH:  Cognitively intact, oriented to person place and time   EKG:   12/22/20: Sinus rhythm, rate 77 bpm; RBBB and low voltage 09/18/2019: Sinus rhythm.  Rate 72 bpm.  Incomplete RBBB 07/30/16 sinus rhythm. Rate 91 bpm. Incomplete right bundle branch block.  Echo 02/21/17 - Left ventricle: The cavity size was normal. There was moderate    concentric hypertrophy. Systolic function was vigorous. The    estimated ejection fraction was in the range of 65% to 70%. Wall    motion was normal; there were no regional wall motion    abnormalities. Features are consistent with a pseudonormal left    ventricular filling pattern, with concomitant abnormal relaxation    and increased filling pressure (grade 2 diastolic dysfunction).    There was no evidence of elevated ventricular filling pressure by    Doppler parameters.  - Aortic root: The aortic root was normal in size.  - Ascending aorta: The ascending aorta was moderately dilated    measuring 45 mm.  - Left atrium: The atrium was mildly dilated.  - Right ventricle: Systolic function was normal.  - Right atrium: The atrium was normal in size.  - Tricuspid valve: There was trivial regurgitation.  - Pulmonary arteries: Systolic pressure was within the normal    range.  - Inferior vena cava: The vessel was normal in size.  - Pericardium, extracardiac: There was no pericardial effusion  Impressions:  - When compared to the prior study from 08/10/2016 ascending aortic    aneurysm has enlarged from 44 to 45 mm. A dedicated chest CTA or    MRA is recommended.   CTA 09/23/16 1. Ascending aortic and aortic arch aneurysm, 4.4 cm and 3.1 cm respectively. Aortic aneurysm NOS (ICD10-I71.9) 2. Recommend annual imaging followup by CTA or MRA. This recommendation follows  2010 ACCF/AHA/AATS/ACR/ASA/SCA/SCAI/SIR/STS/SVM Guidelines for the Diagnosis and Management of Patients with Thoracic Aortic Disease. Circulation. 2010; 121: W431-V400 3. No significant coronary artery calcification. 4. Hepatic steatosis.  Echo 08/10/16: Study Conclusions - Left ventricle: The cavity size was normal. There was mild focal   basal hypertrophy of the septum. Systolic function was normal.   The estimated ejection fraction was in the range of 55% to 60%.   Wall motion was normal; there were no regional wall motion   abnormalities. Doppler parameters are consistent with abnormal   left ventricular relaxation (grade 1 diastolic dysfunction). - Aortic root: The aortic root was mildly dilated. - Ascending aorta: The ascending aorta was mildly dilated. Impressions: - Definity used; normal LV systolic function; mild diastolic   dysfunction; mildly dilated ascending aorta (4.4 cm); suggest CTA   to further assess.    Recent Labs: No results found for requested  labs within last 8760 hours.    Lipid Panel No results found for: CHOL, TRIG, HDL, CHOLHDL, VLDL, LDLCALC, LDLDIRECT    Wt Readings from Last 3 Encounters:  12/22/20 (!) 306 lb 12.8 oz (139.2 kg)  09/18/19 269 lb 12.8 oz (122.4 kg)  05/01/18 274 lb (124.3 kg)      ASSESSMENT AND PLAN: PAF (paroxysmal atrial fibrillation) (HCC) He has not had any recurrent atrial fibrillation.  Continue carvedilol and Eliquis.  CHA2DS2-VASc is 1 for hypertension.  Aneurysm of descending aorta (HCC) Ascending aortic aneurysm.  He is due for repeat echo.  Continue beta-blocker.  Blood pressures well have been controlled.  Essential hypertension Blood pressure is reasonably controlled.  It has been better controlled at home.  Continue carvedilol and chlorthalidone.  He is going to work on his diet so that he can lose weight again.  Obesity He is going to increase exercise to at least 150 minutes weekly.  He is also going to work  on his diet so that he can lose weight again.  He plans to focus on a high-protein, low carbohydrate diet.  This is worked for him in the past.  He will come back for fasting lipids and CMP.   Current medicines are reviewed at length with the patient today.  The patient does not have concerns regarding medicines.  The following changes have been made:  none  Labs/ tests ordered today include:   Orders Placed This Encounter  Procedures   Lipid panel   Comprehensive metabolic panel   EKG 12-Lead   ECHOCARDIOGRAM COMPLETE     Disposition:   FU with Jazzmyn Filion C. Duke Salvia, MD, [facility] in 1 year  I,Mykaella Javier,acting as a scribe for Chilton Si, MD.,have documented all relevant documentation on the behalf of Chilton Si, MD,as directed by  Chilton Si, MD while in the presence of Chilton Si, MD.  I, Jonn Chaikin C. Duke Salvia, MD have reviewed all documentation for this visit.  The documentation of the exam, diagnosis, procedures, and orders on 12/22/2020 are all accurate and complete.   Signed, Meyah Corle C. Duke Salvia, MD, [facility]  12/22/2020 10:57 AM    [facility]

## 2020-12-22 NOTE — Patient Instructions (Addendum)
Medication Instructions:  Your physician recommends that you continue on your current medications as directed. Please refer to the Current Medication list given to you today.  *If you need a refill on your cardiac medications before your next appointment, please call your pharmacy*  Lab Work: FASTING LP/CMET SOON   If you have labs (blood work) drawn today and your tests are completely normal, you will receive your results only by: MyChart Message (if you have MyChart) OR A paper copy in the mail If you have any lab test that is abnormal or we need to change your treatment, we will call you to review the results.  Testing/Procedures: Your physician has requested that you have an echocardiogram. Echocardiography is a painless test that uses sound waves to create images of your heart. It provides your doctor with information about the size and shape of your heart and how well your heart's chambers and valves are working. This procedure takes approximately one hour. There are no restrictions for this procedure.  Follow-Up: At [facility], you and your health needs are our priority.  As part of our continuing mission to provide you with exceptional heart care, we have created designated Provider Care Teams.  These Care Teams include your primary Cardiologist (physician) and Advanced Practice Providers (APPs -  Physician Assistants and Nurse Practitioners) who all work together to provide you with the care you need, when you need it.  We recommend signing up for the patient portal called "MyChart".  Sign up information is provided on this After Visit Summary.  MyChart is used to connect with patients for Virtual Visits (Telemedicine).  Patients are able to view lab/test results, encounter notes, upcoming appointments, etc.  Non-urgent messages can be sent to your provider as well.   To learn more about what you can do with MyChart, go to [email or web address].    Your next appointment:   12  month(s)  The format for your next appointment:   In Person  Provider:   Chilton Si, MD{

## 2020-12-22 NOTE — Assessment & Plan Note (Addendum)
He has not had any recurrent atrial fibrillation.  Continue carvedilol and Eliquis.  CHA2DS2-VASc is 1 for hypertension.

## 2020-12-22 NOTE — Assessment & Plan Note (Signed)
Blood pressure is reasonably controlled.  It has been better controlled at home.  Continue carvedilol and chlorthalidone.  He is going to work on his diet so that he can lose weight again.

## 2020-12-22 NOTE — Assessment & Plan Note (Addendum)
He is going to increase exercise to at least 150 minutes weekly.  He is also going to work on his diet so that he can lose weight again.  He plans to focus on a high-protein, low carbohydrate diet.  This is worked for him in the past.  He will come back for fasting lipids and CMP.

## 2021-01-16 ENCOUNTER — Other Ambulatory Visit (HOSPITAL_BASED_OUTPATIENT_CLINIC_OR_DEPARTMENT_OTHER): Payer: Self-pay

## 2021-01-16 ENCOUNTER — Encounter (HOSPITAL_BASED_OUTPATIENT_CLINIC_OR_DEPARTMENT_OTHER): Payer: Self-pay | Admitting: Cardiovascular Disease

## 2021-01-16 DIAGNOSIS — Z5181 Encounter for therapeutic drug level monitoring: Secondary | ICD-10-CM

## 2021-01-16 DIAGNOSIS — I10 Essential (primary) hypertension: Secondary | ICD-10-CM

## 2021-01-16 DIAGNOSIS — I480 Paroxysmal atrial fibrillation: Secondary | ICD-10-CM

## 2021-01-16 MED ORDER — CARVEDILOL 12.5 MG PO TABS: 12.5000 mg | ORAL_TABLET | Freq: Two times a day (BID) | ORAL | 3 refills | Status: DC

## 2021-02-02 NOTE — Telephone Encounter (Signed)
For your review

## 2021-02-05 ENCOUNTER — Telehealth (HOSPITAL_BASED_OUTPATIENT_CLINIC_OR_DEPARTMENT_OTHER): Payer: Self-pay

## 2021-02-05 NOTE — Telephone Encounter (Signed)
Called facility that Echo was performed at to request that echo report be faxed to [place].    Records are being sent!

## 2021-02-10 LAB — LIPID PANEL
Chol/HDL Ratio: 5.1 ratio — ABNORMAL HIGH (ref 0.0–5.0)
Cholesterol, Total: 189 mg/dL (ref 100–199)
HDL: 37 mg/dL — ABNORMAL LOW (ref >39)
LDL Chol Calc (NIH): 121 mg/dL — ABNORMAL HIGH (ref 0–99)
Triglycerides: 174 mg/dL — ABNORMAL HIGH (ref 0–149)
VLDL Cholesterol Cal: 31 mg/dL (ref 5–40)

## 2021-02-10 LAB — CBC WITH DIFFERENTIAL/PLATELET
Basophils Absolute: 0.1 10*3/uL (ref 0.0–0.2)
Basos: 1 %
EOS (ABSOLUTE): 0.4 10*3/uL (ref 0.0–0.4)
Eos: 4 %
Hematocrit: 43.8 % (ref 37.5–51.0)
Hemoglobin: 14.9 g/dL (ref 13.0–17.7)
Immature Grans (Abs): 0.1 10*3/uL (ref 0.0–0.1)
Immature Granulocytes: 1 %
Lymphocytes Absolute: 2.7 10*3/uL (ref 0.7–3.1)
Lymphs: 29 %
MCH: 30.2 pg (ref 26.6–33.0)
MCHC: 34 g/dL (ref 31.5–35.7)
MCV: 89 fL (ref 79–97)
Monocytes Absolute: 0.9 10*3/uL (ref 0.1–0.9)
Monocytes: 9 %
Neutrophils Absolute: 5.1 10*3/uL (ref 1.4–7.0)
Neutrophils: 56 %
Platelets: 304 10*3/uL (ref 150–450)
RBC: 4.93 x10E6/uL (ref 4.14–5.80)
RDW: 12.4 % (ref 11.6–15.4)
WBC: 9.1 10*3/uL (ref 3.4–10.8)

## 2021-02-10 LAB — COMPREHENSIVE METABOLIC PANEL
ALT: 21 IU/L (ref 0–44)
AST: 23 IU/L (ref 0–40)
Albumin/Globulin Ratio: 1.7 (ref 1.2–2.2)
Albumin: 4.5 g/dL (ref 3.8–4.9)
Alkaline Phosphatase: 80 IU/L (ref 44–121)
BUN/Creatinine Ratio: 12 (ref 9–20)
BUN: 14 mg/dL (ref 6–24)
Bilirubin Total: 0.4 mg/dL (ref 0.0–1.2)
CO2: 18 mmol/L — ABNORMAL LOW (ref 20–29)
Calcium: 9.1 mg/dL (ref 8.7–10.2)
Chloride: 99 mmol/L (ref 96–106)
Creatinine, Ser: 1.17 mg/dL (ref 0.76–1.27)
Globulin, Total: 2.7 g/dL (ref 1.5–4.5)
Glucose: 82 mg/dL (ref 70–99)
Potassium: 4.5 mmol/L (ref 3.5–5.2)
Sodium: 137 mmol/L (ref 134–144)
Total Protein: 7.2 g/dL (ref 6.0–8.5)
eGFR: 74 mL/min/{1.73_m2} (ref >59)

## 2021-02-23 ENCOUNTER — Encounter (HOSPITAL_BASED_OUTPATIENT_CLINIC_OR_DEPARTMENT_OTHER): Payer: Self-pay

## 2021-03-11 NOTE — Addendum Note (Signed)
Addended by: Gerald Stabs on: 03/11/2021 01:48 PM ? ? Modules accepted: Orders ? ?

## 2021-04-09 ENCOUNTER — Encounter (HOSPITAL_BASED_OUTPATIENT_CLINIC_OR_DEPARTMENT_OTHER): Payer: Self-pay | Admitting: *Deleted

## 2021-04-20 MED ORDER — CARVEDILOL 12.5 MG PO TABS: 12.5000 mg | ORAL_TABLET | Freq: Two times a day (BID) | ORAL | 3 refills | Status: DC

## 2021-04-20 MED ORDER — CHLORTHALIDONE 25 MG PO TABS: ORAL_TABLET | ORAL | 3 refills | Status: DC

## 2021-04-27 ENCOUNTER — Other Ambulatory Visit: Payer: Self-pay

## 2021-04-27 MED ORDER — APIXABAN 5 MG PO TABS: 5.0000 mg | ORAL_TABLET | Freq: Two times a day (BID) | ORAL | 1 refills | Status: DC

## 2021-04-27 NOTE — Telephone Encounter (Signed)
Prescription refill request for Eliquis received. ?Indication:Afib ?Last office visit:12/22 ?Scr:1.1 ?Age: 56 ?Weight:139.2 kg ? ?Prescription refilled ? ?

## 2021-05-07 ENCOUNTER — Other Ambulatory Visit (HOSPITAL_BASED_OUTPATIENT_CLINIC_OR_DEPARTMENT_OTHER): Payer: Self-pay | Admitting: *Deleted

## 2021-05-07 MED ORDER — APIXABAN 5 MG PO TABS: 5.0000 mg | ORAL_TABLET | Freq: Two times a day (BID) | ORAL | 1 refills | Status: DC

## 2021-08-05 ENCOUNTER — Encounter (HOSPITAL_BASED_OUTPATIENT_CLINIC_OR_DEPARTMENT_OTHER): Payer: Self-pay | Admitting: Cardiovascular Disease

## 2021-08-05 MED ORDER — CARVEDILOL 12.5 MG PO TABS: 12.5000 mg | ORAL_TABLET | Freq: Two times a day (BID) | ORAL | 3 refills | Status: DC

## 2021-09-02 NOTE — Telephone Encounter (Signed)
Echo 01/2021 scanned into chart.  Does not appear he has had his lipid panel yet. Likely just needs a reminder.   Alver Sorrow, NP

## 2022-05-03 ENCOUNTER — Encounter (HOSPITAL_BASED_OUTPATIENT_CLINIC_OR_DEPARTMENT_OTHER): Payer: Self-pay | Admitting: Cardiovascular Disease

## 2022-05-03 MED ORDER — CHLORTHALIDONE 25 MG PO TABS: ORAL_TABLET | ORAL | 0 refills | Status: DC

## 2022-05-03 NOTE — Addendum Note (Signed)
Addended by: Marlene Lard on: 05/03/2022 10:40 AM   Modules accepted: Orders

## 2022-06-14 ENCOUNTER — Encounter (HOSPITAL_BASED_OUTPATIENT_CLINIC_OR_DEPARTMENT_OTHER): Payer: Self-pay | Admitting: Cardiovascular Disease

## 2022-06-14 MED ORDER — APIXABAN 5 MG PO TABS: 5.0000 mg | ORAL_TABLET | Freq: Two times a day (BID) | ORAL | 3 refills | Status: DC

## 2022-06-20 ENCOUNTER — Encounter (INDEPENDENT_AMBULATORY_CARE_PROVIDER_SITE_OTHER): Payer: Self-pay

## 2022-06-21 ENCOUNTER — Encounter (HOSPITAL_BASED_OUTPATIENT_CLINIC_OR_DEPARTMENT_OTHER): Payer: Self-pay | Admitting: Family

## 2022-06-21 ENCOUNTER — Ambulatory Visit (INDEPENDENT_AMBULATORY_CARE_PROVIDER_SITE_OTHER): Payer: Self-pay | Admitting: Family

## 2022-06-21 VITALS — BP 120/84 | HR 69 | Ht 73.0 in | Wt 301.5 lb

## 2022-06-21 DIAGNOSIS — Z6839 Body mass index (BMI) 39.0-39.9, adult: Secondary | ICD-10-CM

## 2022-06-21 DIAGNOSIS — I7121 Aneurysm of the ascending aorta, without rupture: Secondary | ICD-10-CM

## 2022-06-21 DIAGNOSIS — I10 Essential (primary) hypertension: Secondary | ICD-10-CM

## 2022-06-21 DIAGNOSIS — D6859 Other primary thrombophilia: Secondary | ICD-10-CM

## 2022-06-21 DIAGNOSIS — I480 Paroxysmal atrial fibrillation: Secondary | ICD-10-CM

## 2022-06-21 MED ORDER — CARVEDILOL 12.5 MG PO TABS
12.5000 mg | ORAL_TABLET | Freq: Two times a day (BID) | ORAL | 3 refills | Status: DC
Start: 2022-06-21 — End: 2023-07-12

## 2022-06-21 MED ORDER — CHLORTHALIDONE 25 MG PO TABS
ORAL_TABLET | ORAL | 3 refills | Status: DC
Start: 2022-06-21 — End: 2023-07-27

## 2022-06-21 NOTE — Patient Instructions (Addendum)
Medication Instructions:  Continue your current medications.   *If you need a refill on your cardiac medications before your next appointment, please call your pharmacy*  Lab Work: Your physician recommends that you return for lab work today: CBC, CMP, lipid panel  If you have labs (blood work) drawn today and your tests are completely normal, you will receive your results only by: MyChart Message (if you have MyChart) OR A paper copy in the mail If you have any lab test that is abnormal or we need to change your treatment, we will call you to review the results.  Testing/Procedures: Your physician has requested that you have an echocardiogram at Dr. Roseanne Kaufman office. Echocardiography is a painless test that uses sound waves to create images of your heart. It provides your doctor with information about the size and shape of your heart and how well your heart's chambers and valves are working. This procedure takes approximately one hour. There are no restrictions for this procedure. Please do NOT wear cologne, perfume, aftershave, or lotions (deodorant is allowed). Please arrive 15 minutes prior to your appointment time.   Follow-Up: At [facility], you and your health needs are our priority.  As part of our continuing mission to provide you with exceptional heart care, we have created designated Provider Care Teams.  These Care Teams include your primary Cardiologist (physician) and Advanced Practice Providers (APPs -  Physician Assistants and Nurse Practitioners) who all work together to provide you with the care you need, when you need it.  We recommend signing up for the patient portal called "MyChart".  Sign up information is provided on this After Visit Summary.  MyChart is used to connect with patients for Virtual Visits (Telemedicine).  Patients are able to view lab/test results, encounter notes, upcoming appointments, etc.  Non-urgent messages can be sent to your provider as  well.   To learn more about what you can do with MyChart, go to [email or web address].    Your next appointment:   1 year(s)  Provider:   Chilton Si, MD    Other Instructions    Information About Your Aneurysm  One of your tests has shown an aneurysm of your ascending aorta. The word "aneurysm" refers to a bulge in an artery (blood vessel). Most people think of them in the context of an emergency, but yours was found incidentally. At this point there is nothing you need to do from a procedure standpoint, but there are some important things to keep in mind for day-to-day life.  Mainstays of therapy for aneurysms include very good blood pressure control, healthy lifestyle, and avoiding tobacco products and street drugs. Research has raised concern that antibiotics in the fluoroquinolone class could be associated with increased risk of having an aneurysm develop or tear. This includes medicines that end in "floxacin," like Cipro or Levaquin. Make sure to discuss this information with other healthcare providers if you require antibiotics.  Since aneurysms can run in families, you should discuss your diagnosis with first degree relatives as they may need to be screened for this. Regular mild-moderate physical exercise is important, but avoid heavy lifting/weight lifting over 30lbs, chopping wood, shoveling snow or digging heavy earth with a shovel. It is best to avoid activities that cause grunting or straining (medically referred to as a "Valsalva maneuver"). This happens when a person bears down against a closed throat to increase the strength of arm or abdominal muscles. There's often a tendency to do this when lifting heavy  weights, doing sit-ups, push-ups or chin-ups, etc., but it may be harmful.  This is a finding I would expect to be monitored periodically by your cardiology team. Most unruptured thoracic aortic aneurysms cause no symptoms, so they are often found during exams for other  conditions. Contact a health care provider if you develop any discomfort in your upper back, neck, abdomen, trouble swallowing, cough or hoarseness, or unexplained weight loss. Get help right away if you develop severe pain in your upper back or abdomen that may move into your chest and arms, or any other concerning symptoms such as shortness of breath or fever.

## 2022-06-21 NOTE — Progress Notes (Unsigned)
Office Visit    Patient Name: Kenneth Kirby Date of Encounter: 06/21/2022  PCP:  Patient, No Pcp Per   [facility]  Cardiologist:  Chilton Si, MD  Advanced Practice Provider:  No care team member to display Electrophysiologist:  None      Chief Complaint    Kenneth Kirby is a 57 y.o. male presents today for follow-up of atrial fibrillation and aortic aneurysm  Past Medical History    Past Medical History:  Diagnosis Date   Aneurysm of ascending aorta (HCC) 07/2016   4.4 cm   Aneurysm of descending aorta (HCC) 09/15/2016   4.4 cm by echo 08/2016.   Essential hypertension 09/15/2016   Hypertension    Obesity    Paroxysmal atrial fibrillation (HCC)    No past surgical history on file.  Allergies  No Known Allergies  History of Present Illness    Kenneth Kirby is a 57 y.o. male with a hx of paroxysmal atrial fibrillation, RBBB, ascending aortic aneurysm, hypertension last seen 12/22/2020.  New onset atrial fibrillation 07/2016.  Underwent cardioversion in the ER and was started on diltiazem 120 mg daily, Eliquis 5 mg twice daily.  Echo 08/10/2016 LVEF 55 to 60%, grade 1 diastolic dysfunction, left atrium normal in size, ascending aortic aneurysm 4.4 cm.  Last seen 12/0/22 by Dr. Duke Salvia.  He was working on weight loss after gaining weight during the pandemic. Echo at outside provider 02/02/21 with low normal LVEF 50-55%, gr1DD, ascending aorta 4.25 cm, bilateral atrial mildly dilated, mild MR, minimal TR.   Presents today for follow-up. Pleasant gentleman who works in [employer]. Reports no shortness of breath with only mild dyspnea on exertion with more than usual activity. Reports no chest pain, pressure, or tightness. No edema, orthopnea, PND. Reports no palpitations.  Last episode of atrial fibrillation about a year and a half ago which was short and self resolved. Reports daytime somnolence which he attributes to sleep apnea which is presently  untreated.  EKGs/Labs/Other Studies Reviewed:   The following studies were reviewed today: Cardiac Studies & Procedures       ECHOCARDIOGRAM  ECHOCARDIOGRAM COMPLETE 02/21/2017  Narrative *[facility]* [address] [place], [place] [address] [phone]  ------------------------------------------------------------------- Echocardiography  Patient:    Kenneth, Kirby MR #:       [id number] Study Date: 02/21/2017 Gender:     M Age:        52 Height:     182.9 cm Weight:     135.6 kg BSA:        2.68 m^2 Pt. Status: Room:  SONOGRAPHER  [place], Will ATTENDING    Kenneth Kirby, M.D. PERFORMING   [facility], Outpatient ORDERING     Chilton Si, MD REFERRING    Chilton Si, MD  cc:  ------------------------------------------------------------------- LV EF: 65% -   70%  ------------------------------------------------------------------- Indications:      (I71.2).  ------------------------------------------------------------------- History:   PMH:  Ascending aortic aneurysm. Paroxysmal atrial fibrillation. Acquired from the patient and from the patient&'s chart.  Risk factors:  Hypertension. Obese.  ------------------------------------------------------------------- Study Conclusions  - Left ventricle: The cavity size was normal. There was moderate concentric hypertrophy. Systolic function was vigorous. The estimated ejection fraction was in the range of 65% to 70%. Wall motion was normal; there were no regional wall motion abnormalities. Features are consistent with a pseudonormal left ventricular filling pattern, with concomitant abnormal relaxation and increased filling pressure (grade 2 diastolic dysfunction). There was no evidence of  elevated ventricular filling pressure by Doppler parameters. - Aortic root: The aortic root was normal in size. - Ascending aorta: The ascending aorta was moderately dilated measuring 45 mm. - Left atrium: The  atrium was mildly dilated. - Right ventricle: Systolic function was normal. - Right atrium: The atrium was normal in size. - Tricuspid valve: There was trivial regurgitation. - Pulmonary arteries: Systolic pressure was within the normal range. - Inferior vena cava: The vessel was normal in size. - Pericardium, extracardiac: There was no pericardial effusion.  Impressions:  - When compared to the prior study from 08/10/2016 ascending aortic aneurysm has enlarged from 44 to 45 mm. A dedicated chest CTA or MRA is recommended.  ------------------------------------------------------------------- Study data:  Comparison was made to the study of 08/10/2016.  Study status:  Routine.  Procedure:  The patient reported no pain pre or post test. Transthoracic echocardiography for left ventricular function evaluation. Image quality was adequate.  Study completion: There were no complications.          Echocardiography.  M-mode, complete 2D, spectral Doppler, and color Doppler.  Birthdate: Patient birthdate: [birth year].  Age:  Patient is 57 yr old.  Sex: Gender: male.    BMI: 40.5 kg/m^2.  Blood pressure:     134/90 Patient status:  Outpatient.  Study date:  Study date: 02/21/2017. Study time: 09:43 AM.  Location:  [facility]  -------------------------------------------------------------------  ------------------------------------------------------------------- Left ventricle:  The cavity size was normal. There was moderate concentric hypertrophy. Systolic function was vigorous. The estimated ejection fraction was in the range of 65% to 70%. Wall motion was normal; there were no regional wall motion abnormalities. Features are consistent with a pseudonormal left ventricular filling pattern, with concomitant abnormal relaxation and increased filling pressure (grade 2 diastolic dysfunction). There was no evidence of elevated ventricular filling pressure by Doppler  parameters.  ------------------------------------------------------------------- Aortic valve:   Trileaflet; mildly thickened leaflets. Mobility was not restricted.  Doppler:  Transvalvular velocity was within the normal range. There was no stenosis. There was no regurgitation.  ------------------------------------------------------------------- Aorta:  Aortic root: The aortic root was normal in size. Ascending aorta: The ascending aorta was moderately dilated measuring 45 mm.  ------------------------------------------------------------------- Mitral valve:   Structurally normal valve.   Mobility was not restricted.  Doppler:  Transvalvular velocity was within the normal range. There was no evidence for stenosis. There was no regurgitation.    Peak gradient (D): 2 mm Hg.  ------------------------------------------------------------------- Left atrium:  The atrium was mildly dilated.  ------------------------------------------------------------------- Right ventricle:  The cavity size was normal. Wall thickness was normal. Systolic function was normal.  ------------------------------------------------------------------- Pulmonic valve:    Doppler:  Transvalvular velocity was within the normal range. There was no evidence for stenosis.  ------------------------------------------------------------------- Tricuspid valve:   Structurally normal valve.    Doppler: Transvalvular velocity was within the normal range. There was trivial regurgitation.  ------------------------------------------------------------------- Pulmonary artery:   The main pulmonary artery was normal-sized. Systolic pressure was within the normal range.  ------------------------------------------------------------------- Right atrium:  The atrium was normal in size.  ------------------------------------------------------------------- Pericardium:  There was no pericardial  effusion.  ------------------------------------------------------------------- Systemic veins: Inferior vena cava: The vessel was normal in size.  ------------------------------------------------------------------- Measurements  Left ventricle                         Value        Reference LV ID, ED, PLAX chordal        (L)  40.3  mm     43 - 52 LV ID, ES, PLAX chordal                28.5  mm     23 - 38 LV fx shortening, PLAX chordal         29    %      >=29 LV PW thickness, ED                    12.1  mm     --------- IVS/LV PW ratio, ED            (H)     1.62         <=1.3 Stroke volume, 2D                      99    ml     --------- Stroke volume/bsa, 2D                  37    ml/m^2 --------- LV e&', lateral                         8.48  cm/s   --------- LV E/e&', lateral                       9.32         --------- LV e&', medial                          5.65  cm/s   --------- LV E/e&', medial                        13.98        --------- LV e&', average                         7.07  cm/s   --------- LV E/e&', average                       11.18        ---------  Ventricular septum                     Value        Reference IVS thickness, ED                      19.6  mm     ---------  LVOT                                   Value        Reference LVOT ID, S                             25    mm     --------- LVOT area                              4.91  cm^2   --------- LVOT ID  25    mm     --------- LVOT peak velocity, S                  100   cm/s   --------- LVOT mean velocity, S                  70.2  cm/s   --------- LVOT VTI, S                            20.2  cm     --------- LVOT peak gradient, S                  4     mm Hg  --------- Stroke volume (SV), LVOT DP            99.2  ml     --------- Stroke index (SV/bsa), LVOT DP         37    ml/m^2 ---------  Aorta                                  Value        Reference Aortic root  ID, ED                     38    mm     --------- Ascending aorta ID, A-P, S             45    mm     ---------  Left atrium                            Value        Reference LA ID, A-P, ES                         45    mm     --------- LA ID/bsa, A-P                         1.68  cm/m^2 <=2.2 LA volume, S                           48    ml     --------- LA volume/bsa, S                       17.9  ml/m^2 --------- LA volume, ES, 1-p A4C                 45    ml     --------- LA volume/bsa, ES, 1-p A4C             16.8  ml/m^2 --------- LA volume, ES, 1-p A2C                 50    ml     --------- LA volume/bsa, ES, 1-p A2C             18.6  ml/m^2 ---------  Mitral valve                           Value        Reference  Mitral E-wave peak velocity            79    cm/s   --------- Mitral A-wave peak velocity            63.2  cm/s   --------- Mitral deceleration time               218   ms     150 - 230 Mitral peak gradient, D                2     mm Hg  --------- Mitral E/A ratio, peak                 1.3          ---------  Systemic veins                         Value        Reference Estimated CVP                          3     mm Hg  ---------  Right ventricle                        Value        Reference RV s&', lateral, S                      12.6  cm/s   ---------  Legend: (L)  and  (H)  mark values outside specified reference range.  ------------------------------------------------------------------- Prepared and Electronically Authenticated by  Kenneth Kirby, M.D. 2019-02-11T11:09:50              EKG:  EKG is ordered today.  The ekg ordered today demonstrates NSR 69 bpm with stable RBBB. No acute ST/T wave changes.   Recent Labs: No results found for requested labs within last 365 days.  Recent Lipid Panel    Component Value Date/Time   CHOL 189 02/09/2021 1428   TRIG 174 (H) 02/09/2021 1428   HDL 37 (L) 02/09/2021 1428   CHOLHDL 5.1 (H) 02/09/2021 1428    LDLCALC 121 (H) 02/09/2021 1428    Risk Assessment/Calculations:   CHA2DS2-VASc Score = 1   This indicates a 0.6% annual risk of stroke. The patient's score is based upon: CHF History: 0 HTN History: 1 Diabetes History: 0 Stroke History: 0 Vascular Disease History: 0 Age Score: 0 Gender Score: 0      Home Medications   Current Meds  Medication Sig   apixaban (ELIQUIS) 5 MG TABS tablet Take 1 tablet (5 mg total) by mouth 2 (two) times daily.   carvedilol (COREG) 12.5 MG tablet Take 1 tablet (12.5 mg total) by mouth 2 (two) times daily with a meal.   chlorthalidone (HYGROTON) 25 MG tablet TAKE 1 TABLET(25 MG) BY MOUTH DAILY     Review of Systems      All other systems reviewed and are otherwise negative except as noted above.  Physical Exam    VS:  BP 120/84 (BP Location: Left Arm, Patient Position: Sitting, Cuff Size: Large)   Pulse 69   Ht 6\' 1"  (1.854 m)   Wt (!) 301 lb 8 oz (136.8 kg)   SpO2 98%   BMI 39.78 kg/m  , BMI Body mass index is 39.78 kg/m.  Wt Readings from Last 3 Encounters:  06/21/22 (!) 301 lb 8 oz (136.8 kg)  12/22/20 (!) 306 lb 12.8 oz (139.2 kg)  09/18/19 269 lb 12.8 oz (122.4 kg)    GEN: Well nourished, overweight, well developed, in no acute distress. HEENT: normal. Neck: Supple, no JVD, carotid bruits, or masses. Cardiac: RRR, no murmurs, rubs, or gallops. No clubbing, cyanosis, edema.  Radials/PT 2+ and equal bilaterally.  Respiratory:  Respirations regular and unlabored, clear to auscultation bilaterally. GI: Soft, nontender, nondistended. MS: No deformity or atrophy. Skin: Warm and dry, no rash. Neuro:  Strength and sensation are intact. Psych: Normal affect.  Assessment & Plan    PAF/hypercoagulable state - Maintaining NSR by EKG today. No recent palpitations. Continue Eliquis 5mg  BID, Carvedilol 12.5mg  BID. CHA2DS2-VASc Score = 1 [CHF History: 0, HTN History: 1, Diabetes History: 0, Stroke History: 0, Vascular Disease History: 0,  Age Score: 0, Gender Score: 0].  Therefore, the patient's annual risk of stroke is 0.6 %.   Eliquis-patient assistance approved through 06/2023.  RBBB - Stable finding by EKG. Continue to monitor with periodic EKG. Asymptomatic with no lightheadedness, dizziness.   Aneurysm of ascending aorta - 01/2021 ascending aorta 4.25cm. Continue Carvedilol. Avoid fluoroquinolones. Update echocardiogram for monitoring of aorta size.   Hypertension - BP well controlled. Continue current antihypertensive regimen Coreg 12.5mg  BID, Chlorthalidone 25mg  QD. Discussed to monitor BP at home at least 2 hours after medications and sitting for 5-10 minutes.   Obesity - Weight loss via diet and exercise encouraged. Discussed the impact being overweight would have on cardiovascular risk.   OSA - Untreated.  Prior diagnosis 2018 with recommendation for in lab sleep study at that time after home sleep study due to nocturnal hypoxia.  Lack of insurance makes CPAP cost prohibitive.  Encouraged him to discuss insurance with his employer as he does qualify.  Will refer to social work team for additional assistance.  Will also reach out to the sleep study to see if any charity options for CPAP.         Disposition: Follow up in 1 year(s) with Chilton Si, MD or APP.  Signed, Alver Sorrow, NP 06/21/2022, 10:30 AM [facility]

## 2022-06-22 ENCOUNTER — Telehealth: Payer: Self-pay | Admitting: Licensed Clinical Social Worker

## 2022-06-22 ENCOUNTER — Encounter (HOSPITAL_BASED_OUTPATIENT_CLINIC_OR_DEPARTMENT_OTHER): Payer: Self-pay

## 2022-06-22 LAB — CBC
Hematocrit: 44.6 % (ref 37.5–51.0)
Hemoglobin: 15.1 g/dL (ref 13.0–17.7)
MCH: 30.1 pg (ref 26.6–33.0)
MCHC: 33.9 g/dL (ref 31.5–35.7)
MCV: 89 fL (ref 79–97)
Platelets: 298 10*3/uL (ref 150–450)
RBC: 5.02 x10E6/uL (ref 4.14–5.80)
RDW: 12.4 % (ref 11.6–15.4)
WBC: 10.2 10*3/uL (ref 3.4–10.8)

## 2022-06-22 LAB — COMPREHENSIVE METABOLIC PANEL
ALT: 18 IU/L (ref 0–44)
AST: 21 IU/L (ref 0–40)
Albumin/Globulin Ratio: 1.4
Albumin: 4.1 g/dL (ref 3.8–4.9)
Alkaline Phosphatase: 90 IU/L (ref 44–121)
BUN/Creatinine Ratio: 15 (ref 9–20)
BUN: 19 mg/dL (ref 6–24)
Bilirubin Total: 0.3 mg/dL (ref 0.0–1.2)
CO2: 23 mmol/L (ref 20–29)
Calcium: 9.4 mg/dL (ref 8.7–10.2)
Chloride: 98 mmol/L (ref 96–106)
Creatinine, Ser: 1.29 mg/dL — ABNORMAL HIGH (ref 0.76–1.27)
Globulin, Total: 2.9 g/dL (ref 1.5–4.5)
Glucose: 119 mg/dL — ABNORMAL HIGH (ref 70–99)
Potassium: 4.4 mmol/L (ref 3.5–5.2)
Sodium: 138 mmol/L (ref 134–144)
Total Protein: 7 g/dL (ref 6.0–8.5)
eGFR: 65 mL/min/{1.73_m2} (ref >59)

## 2022-06-22 LAB — LIPID PANEL
Chol/HDL Ratio: 5.1 ratio — ABNORMAL HIGH (ref 0.0–5.0)
Cholesterol, Total: 200 mg/dL — ABNORMAL HIGH (ref 100–199)
HDL: 39 mg/dL — ABNORMAL LOW (ref >39)
LDL Chol Calc (NIH): 125 mg/dL — ABNORMAL HIGH (ref 0–99)
Triglycerides: 205 mg/dL — ABNORMAL HIGH (ref 0–149)
VLDL Cholesterol Cal: 36 mg/dL (ref 5–40)

## 2022-06-22 NOTE — Progress Notes (Unsigned)
  Heart and Vascular Care Navigation  06/22/2022  Kenneth Kirby 22-Feb-1965 [id number]  Reason for Referral: uninsured, no PCP Patient is participating in a Managed Medicaid Plan:{MM YES:[id number]::"Yes"}  Engaged with patient by telephone for initial visit for Heart and Vascular Care Coordination.                                                                                                   Assessment:                                      HRT/VAS Care Coordination     Patients Home Cardiology Office --  [facility]   Outpatient Care Team Social Worker   Social Worker Name: Octavio Graves, [place], [phone]   Living arrangements for the past 2 months Single Family Home   Lives with: Significant Other   Patient Current Insurance Coverage Self-Pay   Patient Has Concern With Paying Medical Bills Yes   Patient Concerns With Medical Bills currently uninsured, working on employer insurance   Medical Bill Referrals: [facility]   Does Patient Have Prescription Coverage? No       Social History:                                                                             SDOH Screenings   Transportation Needs: No Transportation Needs (06/22/2022)  Utilities: Not At Risk (06/22/2022)  Financial Resource Strain: Low Risk  (06/22/2022)  Tobacco Use: Low Risk  (06/21/2022)    SDOH Interventions: Financial Resources:  Financial Strain Interventions: Other (Comment) Civil engineer, contracting; PCP lists) Financial Counseling for [employer] Program  Food Insecurity:   Did not address during call  Housing Insecurity:   Did not address during call  Transportation:   Transportation Interventions: Intervention Not Indicated    Follow-up plan:   LCSW mailed pt application for [employer]- I also included my card and PCP lists. I will route question about CPAP machine to [place].

## 2022-06-24 ENCOUNTER — Encounter (HOSPITAL_BASED_OUTPATIENT_CLINIC_OR_DEPARTMENT_OTHER): Payer: Self-pay | Admitting: Family

## 2022-06-24 ENCOUNTER — Telehealth (HOSPITAL_BASED_OUTPATIENT_CLINIC_OR_DEPARTMENT_OTHER): Payer: Self-pay

## 2022-06-24 DIAGNOSIS — E7849 Other hyperlipidemia: Secondary | ICD-10-CM

## 2022-06-24 NOTE — Addendum Note (Signed)
Addended by: Marlene Lard on: 06/24/2022 02:16 PM   Modules accepted: Orders

## 2022-06-24 NOTE — Telephone Encounter (Addendum)
Seen by patient Kenneth Kirby on 06/24/2022  1:26 PM; followup mychart message sent to patient.    ----- Message from Alver Sorrow, NP sent at 06/24/2022  1:25 PM EDT ----- Creatinine increased due to dehydration.  Recommend increasing oral fluid intake.  Normal liver enzymes, electrolytes.  CBC no infection or anemia.  Cholesterol is markedly elevated and increased from last year. .  The 10-year ASCVD risk score (Arnett DK, et al., 2019) is: 7.3% which is intermediate.  Recommend aiming for 150 minutes of moderate intensity activity per week and following a heart healthy diet  We can either: Work on lifestyle changes and repeat cholesterol levels in 6 months. Start rosuvastatin 10 mg daily (close to medication) and repeat FLP/LFT in 2 months for monitoring.

## 2022-06-24 NOTE — Telephone Encounter (Signed)
Sleep study 09/2016 results below. I doubt it because the sleep study is >55 years old plus was recommended for in lab study due to hypozia. We would need to be able to adjust settings on the autopap. But I can reach out to sleep team to get their thoughts. Not sure if Dr. Tresa Endo or Dr. Mayford Knife could review to see if autoPAP appropriate.  Alver Sorrow, NP  ___________________________________   Sleep study 09/2016  IMPRESSIONS - Moderate obstructive sleep apnea occurred during this study (AHI = 26.4/h). - No significant central sleep apnea occurred during this study (CAI = 0.0/h). - Severe oxygen desaturation to a nadir of 76%. - Patient snored 72.7% during the sleep.   DIAGNOSIS - Obstructive Sleep Apnea (327.23 [G47.33 ICD-10]) - Nocturnal Hypoxemia (327.26 [G47.36 ICD-10])   RECOMMENDATIONS - In this patient with cardiovascular comorbidities, recommend an in-lab CPAP titration study to optimally treat sleep-disordered breathing and nocturnal oxygen desaturation. - Effort should be made to optimize nasal and oral pharyngeal patency - Positional therapy avoiding supine position during sleep. - Avoid alcohol, sedatives and other CNS depressants that may worsen sleep apnea and disrupt normal sleep architecture. - Sleep hygiene should be reviewed to assess factors that may improve sleep quality. - Weight management and regular exercise should be initiated.

## 2022-07-06 ENCOUNTER — Encounter (HOSPITAL_BASED_OUTPATIENT_CLINIC_OR_DEPARTMENT_OTHER): Payer: Self-pay

## 2022-07-06 NOTE — Telephone Encounter (Signed)
Echo from 06/25/22 received from Dr. Roseanne Kaufman office (performed at outside provider as less expensive for self-pay).   Echo reveals normal LVEF 50-55%, gr1DD, mild MR, trace TR, bilateral atria mildly dilated. Ascending aorta stable with the following measurements: ascending 2.25 cm, abdominal aorta 2.15 cm  Kenneth Sorrow, NP

## 2022-07-07 ENCOUNTER — Telehealth: Payer: Self-pay | Admitting: *Deleted

## 2022-07-07 NOTE — Telephone Encounter (Signed)
Form has been mailed

## 2022-07-07 NOTE — Telephone Encounter (Signed)
-----   Message from Alver Sorrow, NP sent at 06/24/2022  4:23 PM EDT ----- Could you possibly mail one for him? ----- Message ----- From: Reesa Chew, CMA Sent: 06/24/2022   4:19 PM EDT To: Alver Sorrow, NP  There is a cpap assistance form with a one time $100 fee. Sleep apnea.org- American Sleep Apnea Association-Phone-612-520-7223. We have the forms here. ----- Message ----- From: Alver Sorrow, NP Sent: 06/24/2022   1:13 PM EDT To: Cv Div Sleep Studies  Options for charity for CPAP by chance?

## 2022-07-09 ENCOUNTER — Telehealth: Payer: Self-pay | Admitting: Licensed Clinical Social Worker

## 2022-07-09 NOTE — Telephone Encounter (Signed)
H&V Care Navigation CSW Progress Note  Clinical Social Worker contacted patient by phone to f/u on resources sent. No answer today at [phone]. Will re-attempt as able.  Patient is participating in a Managed Medicaid Plan:  No, self pay only  SDOH Screenings   Transportation Needs: No Transportation Needs (06/22/2022)  Utilities: Not At Risk (06/22/2022)  Financial Resource Strain: Low Risk  (06/22/2022)  Tobacco Use: Low Risk  (06/24/2022)    Octavio Graves, MSW, LCSW Clinical Social Worker II [facility] Health Heart/Vascular Care Navigation  [phone]- work cell phone (preferred) [phone]- desk phone

## 2022-07-20 ENCOUNTER — Telehealth: Payer: Self-pay | Admitting: Licensed Clinical Social Worker

## 2022-07-20 NOTE — Telephone Encounter (Signed)
H&V Care Navigation CSW Progress Note  Clinical Social Worker contacted patient by phone to f/u on assistance applications. Pt reached at [phone], re-introduced self, role, reason for call. Pt confirmed he has applications, has been working on them and will mail them when done. Encouraged pt to utilize addresses on applications to mail them to appropriate processing agencies instead of mailing back to office. No questions at this time. Remain available as needed.  Patient is participating in a Managed Medicaid Plan:  No, self pay only.   SDOH Screenings   Transportation Needs: No Transportation Needs (06/22/2022)  Utilities: Not At Risk (06/22/2022)  Financial Resource Strain: Low Risk  (06/22/2022)  Tobacco Use: Low Risk  (06/24/2022)   Kenneth Kirby, MSW, LCSW Clinical Social Worker II [facility] Health Heart/Vascular Care Navigation  [phone]- work cell phone (preferred) [phone]- desk phone

## 2022-08-06 ENCOUNTER — Telehealth: Payer: Self-pay | Admitting: Licensed Clinical Social Worker

## 2022-08-06 NOTE — Telephone Encounter (Signed)
H&V Care Navigation CSW Progress Note  Clinical Social Worker contacted patient by phone to f/u on assistance applications and PCP list. No answer this morning at [phone]; left voicemail. I remain available should pt re-engage with care navigation.  Patient is participating in a Managed Medicaid Plan:  No, self pay only  SDOH Screenings   Transportation Needs: No Transportation Needs (06/22/2022)  Utilities: Not At Risk (06/22/2022)  Financial Resource Strain: Low Risk  (06/22/2022)  Tobacco Use: Low Risk  (06/24/2022)   Octavio Graves, MSW, LCSW Clinical Social Worker II [facility] Health Heart/Vascular Care Navigation  [phone]- work cell phone (preferred) [phone]- desk phone

## 2023-04-18 ENCOUNTER — Telehealth: Payer: Self-pay | Admitting: Pharmacy Technician

## 2023-04-18 NOTE — Telephone Encounter (Signed)
 PAP: Patient assistance application for Eliquis through [employer] ([employer]) has been mailed to pt's home address on file. Provider portion of application will be faxed to provider's office.

## 2023-04-22 ENCOUNTER — Other Ambulatory Visit: Payer: Self-pay | Admitting: *Deleted

## 2023-04-22 DIAGNOSIS — I480 Paroxysmal atrial fibrillation: Secondary | ICD-10-CM

## 2023-04-22 MED ORDER — APIXABAN 5 MG PO TABS
5.0000 mg | ORAL_TABLET | Freq: Two times a day (BID) | ORAL | 1 refills | Status: DC
Start: 2023-04-22 — End: 2023-04-22

## 2023-04-22 MED ORDER — APIXABAN 5 MG PO TABS
5.0000 mg | ORAL_TABLET | Freq: Two times a day (BID) | ORAL | 1 refills | Status: AC
Start: 2023-04-22 — End: ?

## 2023-04-22 NOTE — Telephone Encounter (Signed)
 Eliquis 5mg  refill request received via fax. Patient is 58 years old, weight-136.8kg, Crea-1.29 on 06/22/22, Diagnosis-Afib, and last seen by Gillian Shields on 06/21/22. Dose is appropriate based on dosing criteria. Will send in refill to requested pharmacy.

## 2023-05-18 NOTE — Telephone Encounter (Signed)
 In line for processing- allow 3-5 business days

## 2023-05-20 ENCOUNTER — Encounter: Payer: Self-pay | Admitting: Pharmacy Technician

## 2023-05-20 NOTE — Telephone Encounter (Signed)
   Sent the patient a mychart about approval

## 2023-07-09 ENCOUNTER — Other Ambulatory Visit (HOSPITAL_BASED_OUTPATIENT_CLINIC_OR_DEPARTMENT_OTHER): Payer: Self-pay | Admitting: Family

## 2023-07-09 DIAGNOSIS — I10 Essential (primary) hypertension: Secondary | ICD-10-CM

## 2023-07-18 ENCOUNTER — Encounter (HOSPITAL_BASED_OUTPATIENT_CLINIC_OR_DEPARTMENT_OTHER): Payer: Self-pay | Admitting: Cardiovascular Disease

## 2023-07-18 DIAGNOSIS — I10 Essential (primary) hypertension: Secondary | ICD-10-CM

## 2023-07-20 NOTE — Progress Notes (Signed)
 Cardiology Office Note   Date:  07/25/2023  ID:  Kenneth Kirby, DOB 12-Oct-1965, MRN [id number] PCP: Patient, No Pcp Per  [facility] Providers Cardiologist:  Annabella Scarce, MD     [facility] Paroxsymal trial fibrillation Descending aortic aneurysm Right BBB Hypertension Obesity Sleep apnea  New onset atrial fibrillation 07/2016.  Underwent cardioversion in the ER and was started on diltiazem  120 mg daily, Eliquis  5 mg twice daily.  Echo 08/10/2016 LVEF 55 to 60%, G1 DD, left atrium normal in size, ascending aortic aneurysm 4.4 cm.  Seen by Dr. Scarce 12/2020.  He was working on weight loss after gaining weight during the pandemic.  Echo from outside provider 02/02/2021 with low normal LVEF 50 to 55%, G1 DD, ascending aorta 4.25 cm, bilateral atrial mildly dilated, mild MR, minimal TR.  Last cardiology clinic visit was 06/21/2022 with Reche Finder, NP.  He was working in [employer] at the time.  He reported only mild dyspnea on exertion with more than usual activity.  Last episode of atrial fibrillation was 1-1/2 years prior and was short and self resolved.  He reported daytime somnolence which she attributed to untreated sleep apnea.  EKG revealed NSR.  He was continued on Eliquis  5 mg twice daily for stroke prevention for CHA2DS2-VASc score of 1, carvedilol  12.5 mg twice daily.  Echo completed at outside facility revealed mildly reduced LVEF 50 to 55%, no significant valve disease, mild BAE, stable ascending aortic dilatation.  He was connected with resources for management of sleep apnea.  LDL cholesterol was 125 and rosuvastatin 10 mg daily was recommended, however he wanted to work on lifestyle modification prior to starting medication.  History of Present Illness Discussed the use of AI scribe software for clinical note transcription with the patient, who gave verbal consent to proceed.  History of Present Illness Kenneth Kirby is a very pleasant 58 year old male who is here today for  follow-up of aortic dilatation and hyperlipidemia. Aortic dilatation initially identified on CT in 2018, has been monitored with routine echocardiograms and has been stable.  He has concerns about healthcare costs and insurance issues. LDL cholesterol is 125 mg/dL. He is not on statin therapy and is considering lifestyle changes for management. He is on chlorthalidone  for high blood pressure and requires a refill. He also takes Eliquis , which is provided for free until June of next year. He reports multiple challenges maintaining a healthy diet due to work and family commitments, though he has cut out sugared sodas. Enjoyed bench pressing but this is limited due to aortic aneurysm. Reports occasional shortness of breath with heavier exertion. He denies chest pain, palpitations, edema, presyncope, syncope, PND, or orthopnea. No episodes of atrial fibrillation in over a year, as confirmed by his watch.    The 10-year ASCVD risk score (Arnett DK, et al., 2019) is: 6.8%   Values used to calculate the score:     Age: 36 years     Clincally relevant sex: Male     Is Non-Hispanic African American: No     Diabetic: No     Tobacco smoker: No     Systolic Blood Pressure: 110 mmHg     Is BP treated: Yes     HDL Cholesterol: 39 mg/dL     Total Cholesterol: 200 mg/dL   ROS: See HPI  Studies Reviewed EKG Interpretation Date/Time:  Monday July 25 2023 09:12:06 EDT Ventricular Rate:  75 PR Interval:  190 QRS Duration:  110 QT Interval:  408 QTC Calculation: 455 R Axis:   57  Text Interpretation: Normal sinus rhythm Low voltage QRS Incomplete right bundle branch block Nonspecific T wave abnormality Confirmed by Percy Browning [phone]) on 07/25/2023 9:47:12 AM    No results found for: LIPOA  Risk Assessment/Calculations  CHA2DS2-VASc Score = 1   This indicates a 0.6% annual risk of stroke. The patient's score is based upon: CHF History: 0 HTN History: 1 Diabetes History: 0 Stroke History:  0 Vascular Disease History: 0 Age Score: 0 Gender Score: 0           Physical Exam VS:  BP 110/80   Pulse 75   Ht 6' 1 (1.854 m)   Wt 298 lb (135.2 kg)   SpO2 97%   BMI 39.32 kg/m    Wt Readings from Last 3 Encounters:  07/25/23 298 lb (135.2 kg)  06/21/22 (!) 301 lb 8 oz (136.8 kg)  12/22/20 (!) 306 lb 12.8 oz (139.2 kg)    GEN: Obese, well developed in no acute distress NECK: No JVD; No carotid bruits CARDIAC: RRR, no murmurs, rubs, gallops RESPIRATORY:  Clear to auscultation without rales, wheezing or rhonchi  ABDOMEN: Soft, non-tender, non-distended EXTREMITIES:  No edema; No deformity   Assessment & Plan PAF on chronic anticoagulation EKG today reveals NSR at 75 bpm.  He denies recent episodes of atrial fibrillation. No bleeding concerns.  Continue Eliquis  5 mg twice daily which is appropriate dose for stroke prevention for CHA2DS2-VASc score of 1. We will get CBC today. Continue carvedilol  for HR control.   Aortic aneurysm   Ascending aortic dilatation measuring 4.4 cm initially noted on CT 09/2016.  He has been monitoring routinely through echocardiograms, she is able to get a different provider's office for a more reasonable price.  Stable aortic dilatation on echo as/25 4. Aneurysm precautions reviewed. We will plan for repeat echo for surveillance.   Shortness of breath Occasional shortness of breath with heavier exertion.  Normal LV function, significant valve disease on echo 06/2022.  We are repeating echo for surveillance of the aneurysm.  History of sleep apnea as noted below, working to get coverage for CPAP.  SOB may be secondary to deconditioning, encouraged gradual increase of regular exercise and weight loss.  Hypertension   Blood pressure control is well controlled today. He does not monitor on a consistent basis. We will recheck renal function today. Continue carvedilol , chlorthalidone .   Hyperlipidemia Cardiac Risk assessment Lipid panel completed  06/22/2022 with total cholesterol 200, HDL 39, LDL 125, and triglycerides 205.  Statin therapy was recommended at that time, however he wanted to work on lifestyle modification. Advised that ASCVD risk score is 6.8%, with recommendation for moderate intensity statin. He prefers to avoid statins but is willing to review educational materials.  Would recommend rosuvastatin 10 mg daily.  We will recheck lipid panel when he can return fasting.   Sleep apnea   He is not using CPAP due to cost and lack of follow-up. Emphasized the importance of managing sleep apnea for heart healthy. He discussed cost management with CSW and sleep team notified to help facilitate CPAP acquisition if he chooses.         Dispo: 1 year with Dr. Raford or APP  Signed, Browning Percy, NP-C

## 2023-07-25 ENCOUNTER — Other Ambulatory Visit (HOSPITAL_BASED_OUTPATIENT_CLINIC_OR_DEPARTMENT_OTHER): Payer: Self-pay | Admitting: *Deleted

## 2023-07-25 ENCOUNTER — Encounter (HOSPITAL_BASED_OUTPATIENT_CLINIC_OR_DEPARTMENT_OTHER): Payer: Self-pay | Admitting: Nurse Practitioner

## 2023-07-25 ENCOUNTER — Ambulatory Visit (INDEPENDENT_AMBULATORY_CARE_PROVIDER_SITE_OTHER): Payer: Self-pay | Admitting: Nurse Practitioner

## 2023-07-25 VITALS — BP 110/80 | HR 75 | Ht 73.0 in | Wt 298.0 lb

## 2023-07-25 DIAGNOSIS — D6859 Other primary thrombophilia: Secondary | ICD-10-CM

## 2023-07-25 DIAGNOSIS — I480 Paroxysmal atrial fibrillation: Secondary | ICD-10-CM

## 2023-07-25 DIAGNOSIS — I7121 Aneurysm of the ascending aorta, without rupture: Secondary | ICD-10-CM

## 2023-07-25 DIAGNOSIS — I10 Essential (primary) hypertension: Secondary | ICD-10-CM

## 2023-07-25 DIAGNOSIS — G4730 Sleep apnea, unspecified: Secondary | ICD-10-CM

## 2023-07-25 DIAGNOSIS — R0602 Shortness of breath: Secondary | ICD-10-CM

## 2023-07-25 NOTE — Patient Instructions (Signed)
 Medication Instructions:   Your physician recommends that you continue on your current medications as directed. Please refer to the Current Medication list given to you today.   *If you need a refill on your cardiac medications before your next appointment, please call your pharmacy*  Lab Work:  Your physician recommends that you return for a FASTING lipid profile/cmet/cbc at your convenience, fasting after midnight. Paperwork given to patient today.    If you have labs (blood work) drawn today and your tests are completely normal, you will receive your results only by: MyChart Message (if you have MyChart) OR A paper copy in the mail If you have any lab test that is abnormal or we need to change your treatment, we will call you to review the results.  Testing/Procedures:  Your physician has requested that you have an echocardiogram. Echocardiography is a painless test that uses sound waves to create images of your heart. It provides your doctor with information about the size and shape of your heart and how well your heart's chambers and valves are working. This procedure takes approximately one hour. There are no restrictions for this procedure. Patient given script today to get echo at Dr. Claudetta office.    Follow-Up: At [facility], you and your health needs are our priority.  As part of our continuing mission to provide you with exceptional heart care, our providers are all part of one team.  This team includes your primary Cardiologist (physician) and Advanced Practice Providers or APPs (Physician Assistants and Nurse Practitioners) who all work together to provide you with the care you need, when you need it.  Your next appointment:   1 year(s)  Provider:   Annabella Scarce, MD, Rosaline Bane, NP, or Reche Finder, NP    We recommend signing up for the patient portal called MyChart.  Sign up information is provided on this After Visit Summary.  MyChart is used  to connect with patients for Virtual Visits (Telemedicine).  Patients are able to view lab/test results, encounter notes, upcoming appointments, etc.  Non-urgent messages can be sent to your provider as well.   To learn more about what you can do with MyChart, go to [email or web address].   Other Instructions  Kenneth Kirby, [facility] spoke with patient today.   Contacted sleep department. Stated in chart letter was sent out to patient on cpap assistance. Patient never received this paperwork.   There is a cpap assistance form with a one time $100 fee. You can go to Sleep apnea.,org-american Sleep Apnea Association -Phone-[phone].

## 2023-07-27 MED ORDER — CHLORTHALIDONE 25 MG PO TABS
ORAL_TABLET | ORAL | 3 refills | Status: DC
Start: 2023-07-27 — End: 2023-10-19

## 2023-07-27 NOTE — Progress Notes (Signed)
 Heart and Vascular Care Navigation  07/27/2023- LATE ENTRY  Kenneth Kirby [birth year] [id number]  Reason for Referral: self pay Patient is participating in a Managed Medicaid Plan: No, self pay only  Engaged with patient face to face for initial visit for Heart and Vascular Care Coordination.                                                                                                   Assessment:   LCSW met with pt, previously had engaged telephonically with pt, today in clinic. Introduced self, role, reason for visit. Pt confirmed home address, still employed, resides with his significant other who currently has been recurrently hospitalized for illness. He has been busy working, caring for her, and their various animals at home. He was on her insurance in past but currently self pay. We discussed Medicaid guidelines and how to apply if eligible. Also discussed if ineligible that he should consider applying for [employer] program. This can help with past bills and ongoing medical costs that are eligible for additional discount. Pt provided with this information and my contact information. If I do not hear from him I will f/u over the next few weeks.   No additional concerns noted regarding housing, food, utilities or transportation.                                HRT/VAS Care Coordination     Patients Home Cardiology Office --  [facility]   Outpatient Care Team Social Worker   Social Worker Name: Marit Lark, [place], [phone]   Living arrangements for the past 2 months Single Family Home   Lives with: Significant Other   Patient Current Insurance Coverage Self-Pay   Patient Has Concern With Paying Medical Bills Yes   Patient Concerns With Medical Bills self pay, ongoing medical appts   Medical Bill Referrals: discussed Medicaid expansion and referred   Does Patient Have Prescription Coverage? No       Social History:                                                                              SDOH Screenings   Food Insecurity: No Food Insecurity (07/27/2023)  Housing: Low Risk  (07/27/2023)  Transportation Needs: No Transportation Needs (07/27/2023)  Utilities: Not At Risk (07/27/2023)  Financial Resource Strain: Low Risk  (07/27/2023)  Tobacco Use: Low Risk  (07/25/2023)  Health Literacy: Adequate Health Literacy (07/27/2023)    SDOH Interventions: Financial Resources:  Surveyor, quantity Strain Interventions: Artist, Other (Comment) (Medicaid referral/CAFA) DSS for financial assistance and Editor, commissioning for [employer] Program  Food Insecurity:  Food Insecurity Interventions: Intervention Not Indicated  Housing Insecurity:  Housing Interventions: Intervention Not Indicated  Transportation:  Transportation Interventions: Intervention Not Indicated     Other Care Navigation Interventions:     Provided Pharmacy assistance resources  Pt denies any issues obtaining medications at this time.   Follow-up plan:   LCSW provided pt my card, Medicaid expansion flyer and information about courtesy applications being taken at [facility]/encouraged to see if eligible. If ineligible for Medicaid pt can complete CAFA, provided this application and instructions, available to assist with completion and submission when complete.

## 2023-08-01 ENCOUNTER — Telehealth: Payer: Self-pay | Admitting: Licensed Clinical Social Worker

## 2023-08-01 NOTE — Telephone Encounter (Signed)
 H&V Care Navigation CSW Progress Note  Clinical Social Worker contacted patient by phone to f/u on referral for self pay assistance. Had met with pt during appt with Rosaline, NP, on 7/14. No answer today at [phone]. Left voicemail with my contact. Will re-attempt again as able to answer any additional questions/concerns.  Patient is participating in a Managed Medicaid Plan:  No, self pay only  SDOH Screenings   Food Insecurity: No Food Insecurity (07/27/2023)  Housing: Low Risk  (07/27/2023)  Transportation Needs: No Transportation Needs (07/27/2023)  Utilities: Not At Risk (07/27/2023)  Financial Resource Strain: Low Risk  (07/27/2023)  Tobacco Use: Low Risk  (07/25/2023)  Health Literacy: Adequate Health Literacy (07/27/2023)    Marit Lark, MSW, LCSW Clinical Social Worker II [facility] Health Heart/Vascular Care Navigation  [phone]- work cell phone (preferred)

## 2023-08-02 ENCOUNTER — Ambulatory Visit: Payer: Self-pay | Admitting: Nurse Practitioner

## 2023-08-02 LAB — COMPREHENSIVE METABOLIC PANEL WITH GFR
ALT: 19 IU/L (ref 0–44)
AST: 20 IU/L (ref 0–40)
Albumin: 4.2 g/dL (ref 3.8–4.9)
Alkaline Phosphatase: 83 IU/L (ref 44–121)
BUN/Creatinine Ratio: 11 (ref 9–20)
BUN: 12 mg/dL (ref 6–24)
Bilirubin Total: 0.5 mg/dL (ref 0.0–1.2)
CO2: 24 mmol/L (ref 20–29)
Calcium: 9.2 mg/dL (ref 8.7–10.2)
Chloride: 99 mmol/L (ref 96–106)
Creatinine, Ser: 1.05 mg/dL (ref 0.76–1.27)
Globulin, Total: 2.5 g/dL (ref 1.5–4.5)
Glucose: 89 mg/dL (ref 70–99)
Potassium: 3.5 mmol/L (ref 3.5–5.2)
Sodium: 139 mmol/L (ref 134–144)
Total Protein: 6.7 g/dL (ref 6.0–8.5)
eGFR: 83 mL/min/1.73 (ref >59)

## 2023-08-02 LAB — LIPID PANEL
Chol/HDL Ratio: 5.3 ratio — ABNORMAL HIGH (ref 0.0–5.0)
Cholesterol, Total: 190 mg/dL (ref 100–199)
HDL: 36 mg/dL — ABNORMAL LOW (ref >39)
LDL Chol Calc (NIH): 124 mg/dL — ABNORMAL HIGH (ref 0–99)
Triglycerides: 165 mg/dL — ABNORMAL HIGH (ref 0–149)
VLDL Cholesterol Cal: 30 mg/dL (ref 5–40)

## 2023-08-02 LAB — CBC
Hematocrit: 46 % (ref 37.5–51.0)
Hemoglobin: 15.2 g/dL (ref 13.0–17.7)
MCH: 29.7 pg (ref 26.6–33.0)
MCHC: 33 g/dL (ref 31.5–35.7)
MCV: 90 fL (ref 79–97)
Platelets: 311 x10E3/uL (ref 150–450)
RBC: 5.11 x10E6/uL (ref 4.14–5.80)
RDW: 13.2 % (ref 11.6–15.4)
WBC: 8.4 x10E3/uL (ref 3.4–10.8)

## 2023-08-04 ENCOUNTER — Telehealth: Payer: Self-pay | Admitting: Licensed Clinical Social Worker

## 2023-08-04 NOTE — Telephone Encounter (Signed)
 H&V Care Navigation CSW Progress Note  Clinical Social Worker contacted patient by phone to f/u on assistance applications provided during last visit. Was able to reach him today at [phone]. Pt shares he hasn't reviewed those yet as his partner was d/c from hospital and has returned home/he's had a lot on his plate. He will review and contact me if any questions. I will re-attempt again as able to answer any additional questions/concerns if I do not hear from him.   Patient is participating in a Managed Medicaid Plan:  No, self pay only  SDOH Screenings   Food Insecurity: No Food Insecurity (07/27/2023)  Housing: Low Risk  (07/27/2023)  Transportation Needs: No Transportation Needs (07/27/2023)  Utilities: Not At Risk (07/27/2023)  Financial Resource Strain: Low Risk  (07/27/2023)  Tobacco Use: Low Risk  (07/25/2023)  Health Literacy: Adequate Health Literacy (07/27/2023)     Marit Lark, MSW, LCSW Clinical Social Worker II [facility] Health Heart/Vascular Care Navigation  [phone]- work cell phone (preferred)

## 2023-08-17 ENCOUNTER — Telehealth: Payer: Self-pay | Admitting: Licensed Clinical Social Worker

## 2023-08-17 NOTE — Telephone Encounter (Signed)
 H&V Care Navigation CSW Progress Note  Clinical Social Worker contacted patient by phone to f/u on assistance applications. Was able to reach pt this morning at [phone], shared it has still been a hectic week. He is going to work on application as able. Updated pt about time off next week and how to contact my colleague Jenna if needed during that time.  Patient is participating in a Managed Medicaid Plan:  No, self pay only  SDOH Screenings   Food Insecurity: No Food Insecurity (07/27/2023)  Housing: Low Risk  (07/27/2023)  Transportation Needs: No Transportation Needs (07/27/2023)  Utilities: Not At Risk (07/27/2023)  Financial Resource Strain: Low Risk  (07/27/2023)  Tobacco Use: Low Risk  (07/25/2023)  Health Literacy: Adequate Health Literacy (07/27/2023)    Marit Lark, MSW, LCSW Clinical Social Worker II [facility] Health Heart/Vascular Care Navigation  [phone]- work cell phone (preferred)

## 2023-08-22 NOTE — Telephone Encounter (Signed)
 Called patient and emailed him the cpap application.

## 2023-09-05 ENCOUNTER — Telehealth: Payer: Self-pay | Admitting: Licensed Clinical Social Worker

## 2023-09-05 NOTE — Telephone Encounter (Signed)
 H&V Care Navigation CSW Progress Note  Clinical Social Worker met with patient to f/u on assistance application. Pt brought with him his CAFA. We reviewed documents pt has and information needed. He will go to bank and get needed statements and we discussed securely sending pay stubs and retirement statement to [facility] email so we can assist with printing and submitting this for him. Pt agreeable. He also dropped off an application for [facility]. I reviewed their referral process. New application on website, must go in person. Will discuss once we speak about applications again.  Patient is participating in a Managed Medicaid Plan:  No, self pay only  SDOH Screenings   Food Insecurity: No Food Insecurity (07/27/2023)  Housing: Low Risk  (07/27/2023)  Transportation Needs: No Transportation Needs (07/27/2023)  Utilities: Not At Risk (07/27/2023)  Financial Resource Strain: Low Risk  (07/27/2023)  Tobacco Use: Low Risk  (07/25/2023)  Health Literacy: Adequate Health Literacy (07/27/2023)    Marit Lark, MSW, LCSW Clinical Social Worker II [facility] Health Heart/Vascular Care Navigation  [phone]- work cell phone (preferred)

## 2023-09-05 NOTE — Telephone Encounter (Signed)
 H&V Care Navigation CSW Progress Note  Clinical Social Worker received a call from pt to ask for assistance with submitting paperwork for his CAFA. Shares he and his partner have separated and he has a few logistical questions regarding paperwork and how to submit. Plans to come to [facility] clinic today at 10:15am for assistance.  Patient is participating in a Managed Medicaid Plan:  No, self pay only  SDOH Screenings   Food Insecurity: No Food Insecurity (07/27/2023)  Housing: Low Risk  (07/27/2023)  Transportation Needs: No Transportation Needs (07/27/2023)  Utilities: Not At Risk (07/27/2023)  Financial Resource Strain: Low Risk  (07/27/2023)  Tobacco Use: Low Risk  (07/25/2023)  Health Literacy: Adequate Health Literacy (07/27/2023)    Marit Lark, MSW, LCSW Clinical Social Worker II [facility] Health Heart/Vascular Care Navigation  [phone]- work cell phone (preferred)

## 2023-09-06 ENCOUNTER — Telehealth: Payer: Self-pay | Admitting: Licensed Clinical Social Worker

## 2023-09-06 NOTE — Telephone Encounter (Signed)
 H&V Care Navigation CSW Progress Note  Clinical Social Worker contacted patient by phone to f/u on email documents sent from pt. Received letter from bank, 401K statement and paystubs however incorrect months attached. Was able to reach him at [phone]. Discussed needed months, he was able to attach and send those. Assisted pt with printing and mailing these documents. Shared it takes about a week to receive/process. Also included in response to pt that there is a new process for [facility]. Send link to requirements and mailed new application. Also attached and mailed [facility] which will accept his CAFA discount when processed. No additional questions at this time. Will f/u with any updates.  Patient is participating in a Managed Medicaid Plan:  No, self pay only  SDOH Screenings   Food Insecurity: No Food Insecurity (07/27/2023)  Housing: Low Risk  (07/27/2023)  Transportation Needs: No Transportation Needs (07/27/2023)  Utilities: Not At Risk (07/27/2023)  Financial Resource Strain: Low Risk  (07/27/2023)  Tobacco Use: Low Risk  (07/25/2023)  Health Literacy: Adequate Health Literacy (07/27/2023)    Marit Lark, MSW, LCSW Clinical Social Worker II [facility] Health Heart/Vascular Care Navigation  [phone]- work cell phone (preferred)

## 2023-10-15 ENCOUNTER — Encounter (HOSPITAL_BASED_OUTPATIENT_CLINIC_OR_DEPARTMENT_OTHER): Payer: Self-pay

## 2023-10-15 ENCOUNTER — Other Ambulatory Visit (HOSPITAL_BASED_OUTPATIENT_CLINIC_OR_DEPARTMENT_OTHER): Payer: Self-pay | Admitting: Family

## 2023-10-15 DIAGNOSIS — I10 Essential (primary) hypertension: Secondary | ICD-10-CM

## 2023-10-17 MED ORDER — CARVEDILOL 12.5 MG PO TABS: 12.5000 mg | ORAL_TABLET | Freq: Two times a day (BID) | ORAL | 3 refills | Status: DC

## 2023-10-17 NOTE — Addendum Note (Signed)
 Addended by: MARITZA SOR F on: 10/17/2023 12:17 PM   Modules accepted: Orders

## 2023-10-18 ENCOUNTER — Encounter (HOSPITAL_BASED_OUTPATIENT_CLINIC_OR_DEPARTMENT_OTHER): Payer: Self-pay

## 2023-10-18 DIAGNOSIS — I10 Essential (primary) hypertension: Secondary | ICD-10-CM

## 2023-10-18 MED ORDER — CARVEDILOL 12.5 MG PO TABS: 12.5000 mg | ORAL_TABLET | Freq: Two times a day (BID) | ORAL | 3 refills | Status: AC

## 2023-10-18 MED ORDER — CARVEDILOL 12.5 MG PO TABS: 12.5000 mg | ORAL_TABLET | Freq: Two times a day (BID) | ORAL | 2 refills | Status: DC

## 2023-10-18 NOTE — Addendum Note (Signed)
 Addended by: BLUFORD RAMP D on: 10/18/2023 02:22 PM   Modules accepted: Orders

## 2023-10-19 ENCOUNTER — Encounter (HOSPITAL_BASED_OUTPATIENT_CLINIC_OR_DEPARTMENT_OTHER): Payer: Self-pay

## 2023-10-19 DIAGNOSIS — I10 Essential (primary) hypertension: Secondary | ICD-10-CM

## 2023-10-19 MED ORDER — CHLORTHALIDONE 25 MG PO TABS: ORAL_TABLET | ORAL | 3 refills | Status: AC
